# Patient Record
Sex: Male | Born: 1995 | Race: White | Hispanic: No | Marital: Single | Smoking: Current every day smoker
Health system: Southern US, Community
[De-identification: ages and names within clinical notes are randomized; demographics above are authoritative.]

## PROBLEM LIST (undated history)

## (undated) DIAGNOSIS — I10 Essential (primary) hypertension: Secondary | ICD-10-CM

## (undated) DIAGNOSIS — F909 Attention-deficit hyperactivity disorder, unspecified type: Secondary | ICD-10-CM

## (undated) DIAGNOSIS — I514 Myocarditis, unspecified: Secondary | ICD-10-CM

## (undated) HISTORY — PX: TONSILLECTOMY: SUR1361

## (undated) HISTORY — PX: WRIST SURGERY: SHX841

---
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
## 2014-04-26 ENCOUNTER — Other Ambulatory Visit (HOSPITAL_COMMUNITY): Payer: Self-pay | Admitting: Pulmonary Disease

## 2014-04-26 DIAGNOSIS — M5442 Lumbago with sciatica, left side: Secondary | ICD-10-CM

## 2014-05-07 ENCOUNTER — Ambulatory Visit (HOSPITAL_COMMUNITY): Admission: RE | Admit: 2014-05-07 | Payer: 59 | Source: Ambulatory Visit

## 2014-05-19 ENCOUNTER — Emergency Department
Admit: 2014-05-19 | Disposition: A | Discharge status: Home / Self Care | Payer: Self-pay | Admitting: Emergency Medicine

## 2014-06-01 ENCOUNTER — Other Ambulatory Visit (HOSPITAL_COMMUNITY): Payer: 59

## 2014-06-04 ENCOUNTER — Ambulatory Visit (HOSPITAL_COMMUNITY): Payer: 59

## 2014-10-18 ENCOUNTER — Emergency Department (HOSPITAL_COMMUNITY): Payer: 59

## 2014-10-18 ENCOUNTER — Emergency Department (HOSPITAL_COMMUNITY)
Admission: EM | Admit: 2014-10-18 | Discharge: 2014-10-18 | Disposition: A | Discharge status: Home / Self Care | Payer: 59 | Attending: Emergency Medicine | Admitting: Emergency Medicine

## 2014-10-18 ENCOUNTER — Encounter (HOSPITAL_COMMUNITY): Payer: Self-pay | Admitting: Emergency Medicine

## 2014-10-18 DIAGNOSIS — Z720 Tobacco use: Secondary | ICD-10-CM | POA: Diagnosis not present

## 2014-10-18 DIAGNOSIS — R079 Chest pain, unspecified: Secondary | ICD-10-CM | POA: Diagnosis present

## 2014-10-18 DIAGNOSIS — J9801 Acute bronchospasm: Secondary | ICD-10-CM | POA: Diagnosis not present

## 2014-10-18 LAB — BASIC METABOLIC PANEL
Anion gap: 7 (ref 5–15)
BUN: 15 mg/dL (ref 6–20)
CHLORIDE: 105 mmol/L (ref 101–111)
CO2: 27 mmol/L (ref 22–32)
CREATININE: 0.93 mg/dL (ref 0.61–1.24)
Calcium: 9.3 mg/dL (ref 8.9–10.3)
GFR calc Af Amer: >60 mL/min (ref >60)
GFR calc non Af Amer: >60 mL/min (ref >60)
GLUCOSE: 106 mg/dL — AB (ref 65–99)
POTASSIUM: 3.6 mmol/L (ref 3.5–5.1)
SODIUM: 139 mmol/L (ref 135–145)

## 2014-10-18 LAB — I-STAT TROPONIN, ED: Troponin i, poc: 0.01 ng/mL (ref 0.00–0.08)

## 2014-10-18 LAB — CBC
HEMATOCRIT: 44.8 % (ref 39.0–52.0)
Hemoglobin: 15.4 g/dL (ref 13.0–17.0)
MCH: 30.1 pg (ref 26.0–34.0)
MCHC: 34.4 g/dL (ref 30.0–36.0)
MCV: 87.5 fL (ref 78.0–100.0)
PLATELETS: 311 10*3/uL (ref 150–400)
RBC: 5.12 MIL/uL (ref 4.22–5.81)
RDW: 11.7 % (ref 11.5–15.5)
WBC: 8.2 10*3/uL (ref 4.0–10.5)

## 2014-10-18 MED ORDER — ALBUTEROL SULFATE HFA 108 (90 BASE) MCG/ACT IN AERS
2.0000 | INHALATION_SPRAY | RESPIRATORY_TRACT | Status: DC | PRN
  Filled 2014-10-18: qty 6.7

## 2014-10-18 MED ORDER — ASPIRIN 81 MG PO CHEW
324.0000 mg | CHEWABLE_TABLET | Freq: Once | ORAL | Status: AC
  Administered 2014-10-18: 324 mg via ORAL
  Filled 2014-10-18: qty 4

## 2014-10-18 MED ORDER — IPRATROPIUM-ALBUTEROL 0.5-2.5 (3) MG/3ML IN SOLN
3.0000 mL | RESPIRATORY_TRACT | Status: DC
  Administered 2014-10-18: 3 mL via RESPIRATORY_TRACT
  Filled 2014-10-18: qty 3

## 2014-10-18 NOTE — ED Provider Notes (Signed)
CSN: [id number]   Arrival date & time 10/18/14 0057  History  This chart was scribed for Paula Libra, MD by Bethel Born, ED Scribe. This patient was seen in room WA04/WA04 and the patient's care was started at 2:12 AM.  Chief Complaint  Patient presents with  . Chest Pain    HPI The history is provided by the patient. No language interpreter was used.   Evan Conner is a 19 y.o. male who presents to the Emergency Department complaining of an episode of central chest pain with sudden onset 1.5 hours ago at work. The pain started at the center of the chest and spread across the chest diffusely. He describes the pain as heavy "like someone is sitting on you". Movement and deep breathing exacerbated the pain, and he found it difficult to take a deep breath. The episode lasted for 20 minutes and he is pain free at present. He has had similar episodes at least twice in the past but this is the first time that he is being evaluated for it.  Associated symptoms include dizziness and sweating. Pt denies cough, SOB, nausea, vomiting, and LE pain or swelling. No history of asthma.   History reviewed. No pertinent past medical history.  Past Surgical History  Procedure Laterality Date  . Tonsillectomy      History reviewed. No pertinent family history.  Social History  Substance Use Topics  . Smoking status: Current Every Day Smoker -- 1.50 packs/day  . Smokeless tobacco: Former Neurosurgeon  . Alcohol Use: Yes     Review of Systems 10 Systems reviewed and all are negative for acute change except as noted in the HPI. Home Medications   Prior to Admission medications   Not on File    Allergies  Review of patient's allergies indicates no known allergies.  Triage Vitals: BP 138/78 mmHg  Pulse 62  Temp(Src) 98.3 F (36.8 C) (Oral)  Resp 20  SpO2 98%  Physical Exam General: Well-developed, well-nourished male in no acute distress; appearance consistent with age of record HENT:  normocephalic; atraumatic Eyes: pupils equal, round and reactive to light; extraocular muscles intact Neck: supple Heart: regular rate and rhythm; no murmurs, rubs or gallops Lungs: decreased air movement bilaterally without frank wheezing Abdomen: soft; nondistended; nontender; no masses or hepatosplenomegaly; bowel sounds present Extremities: No deformity or edema; full range of motion; pulses normal Neurologic: Awake, alert and oriented; motor function intact in all extremities and symmetric; no facial droop Skin: Warm and dry Psychiatric: Normal mood and affect  ED Course  Procedures   DIAGNOSTIC STUDIES: Oxygen Saturation is 98% on RA, normal by my interpretation.    COORDINATION OF CARE: 2:18 AM Discussed treatment plan which includes CXR, EKG, lab work, and a breathing treatment with pt at bedside and pt agreed to plan.  EKG Interpretation  Date/Time:  Monday October 18 2014 01:00:50 EDT Ventricular Rate:  72 PR Interval:  151 QRS Duration: 112 QT Interval:  394 QTC Calculation: 431 R Axis:   91 Text Interpretation:  Sinus rhythm Rate is slower, otherwise no significant changes Confirmed by Read Drivers  MD, Jonny Ruiz ([id number]) on 10/18/2014 2:11:32 AM    MDM   Nursing notes and vitals signs, including pulse oximetry, reviewed.  Summary of this visit's results, reviewed by myself:  Labs:  Results for orders placed or performed during the hospital encounter of 10/18/14 (from the past 24 hour(s))  Basic metabolic panel     Status: Abnormal   Collection Time: 10/18/14  1:09 AM  Result Value Ref Range   Sodium 139 135 - 145 mmol/L   Potassium 3.6 3.5 - 5.1 mmol/L   Chloride 105 101 - 111 mmol/L   CO2 27 22 - 32 mmol/L   Glucose, Bld 106 (H) 65 - 99 mg/dL   BUN 15 6 - 20 mg/dL   Creatinine, Ser 7.42 0.61 - 1.24 mg/dL   Calcium 9.3 8.9 - 59.5 mg/dL   GFR calc non Af Amer >60 >60 mL/min   GFR calc Af Amer >60 >60 mL/min   Anion gap 7 5 - 15  CBC     Status: None    Collection Time: 10/18/14  1:09 AM  Result Value Ref Range   WBC 8.2 4.0 - 10.5 K/uL   RBC 5.12 4.22 - 5.81 MIL/uL   Hemoglobin 15.4 13.0 - 17.0 g/dL   HCT 63.8 75.6 - 43.3 %   MCV 87.5 78.0 - 100.0 fL   MCH 30.1 26.0 - 34.0 pg   MCHC 34.4 30.0 - 36.0 g/dL   RDW 29.5 18.8 - 41.6 %   Platelets 311 150 - 400 K/uL  I-stat troponin, ED     Status: None   Collection Time: 10/18/14  1:13 AM  Result Value Ref Range   Troponin i, poc 0.01 0.00 - 0.08 ng/mL   Comment 3            Imaging Studies: Dg Chest 2 View  10/18/2014   CLINICAL DATA:  Bilateral chest pain, onset tonight. Mild cough. Current smoker.  EXAM: CHEST  2 VIEW  COMPARISON:  09/18/2013  FINDINGS: The heart size and mediastinal contours are within normal limits. Both lungs are clear. The visualized skeletal structures are unremarkable.  IMPRESSION: No active cardiopulmonary disease.   Electronically Signed   By: Burman Nieves M.D.   On: 10/18/2014 01:17   3:20 AM Air movement improved, faint expiratory wheeze in right base after DuoNeb treatment. Patient states his breathing is easier and feels improved. Patient's history and exam are more consistent with bronchospasm then cardiac etiology.   I personally performed the services described in this documentation, which was scribed in my presence. The recorded information has been reviewed and is accurate.   Paula Libra, MD 10/18/14 [phone]

## 2014-10-18 NOTE — Discharge Instructions (Signed)
Bronchospasm °A bronchospasm is a spasm or tightening of the airways going into the lungs. During a bronchospasm breathing becomes more difficult because the airways get smaller. When this happens there can be coughing, a whistling sound when breathing (wheezing), and difficulty breathing. Bronchospasm is often associated with asthma, but not all patients who experience a bronchospasm have asthma. °CAUSES  °A bronchospasm is caused by inflammation or irritation of the airways. The inflammation or irritation may be triggered by:  °· Allergies (such as to animals, pollen, food, or mold). Allergens that cause bronchospasm may cause wheezing immediately after exposure or many hours later.   °· Infection. Viral infections are believed to be the most common cause of bronchospasm.   °· Exercise.   °· Irritants (such as pollution, cigarette smoke, strong odors, aerosol sprays, and paint fumes).   °· Weather changes. Winds increase molds and pollens in the air. Rain refreshes the air by washing irritants out. Cold air may cause inflammation.   °· Stress and emotional upset.   °SIGNS AND SYMPTOMS  °· Wheezing.   °· Excessive nighttime coughing.   °· Frequent or severe coughing with a simple cold.   °· Chest tightness.   °· Shortness of breath.   °DIAGNOSIS  °Bronchospasm is usually diagnosed through a history and physical exam. Tests, such as chest X-rays, are sometimes done to look for other conditions. °TREATMENT  °· Inhaled medicines can be given to open up your airways and help you breathe. The medicines can be given using either an inhaler or a nebulizer machine. °· Corticosteroid medicines may be given for severe bronchospasm, usually when it is associated with asthma. °HOME CARE INSTRUCTIONS  °· Always have a plan prepared for seeking medical care. Know when to call your health care provider and local emergency services (911 in the [place] Know where you can access local emergency care. °· Only take medicines as  directed by your health care provider. °· If you were prescribed an inhaler or nebulizer machine, ask your health care provider to explain how to use it correctly. Always use a spacer with your inhaler if you were given one. °· It is necessary to remain calm during an attack. Try to relax and breathe more slowly.  °· Control your home environment in the following ways:   °¨ Change your heating and air conditioning filter at least once a month.   °¨ Limit your use of fireplaces and wood stoves. °¨ Do not smoke and do not allow smoking in your home.   °¨ Avoid exposure to perfumes and fragrances.   °¨ Get rid of pests (such as roaches and mice) and their droppings.   °¨ Throw away plants if you see mold on them.   °¨ Keep your house clean and dust free.   °¨ Replace carpet with wood, tile, or vinyl flooring. Carpet can trap dander and dust.   °¨ Use allergy-proof pillows, mattress covers, and box spring covers.   °¨ Wash bed sheets and blankets every week in hot water and dry them in a dryer.   °¨ Use blankets that are made of polyester or cotton.   °¨ Wash hands frequently. °SEEK MEDICAL CARE IF:  °· You have muscle aches.   °· You have chest pain.   °· The sputum changes from clear or white to yellow, green, gray, or bloody.   °· The sputum you cough up gets thicker.   °· There are problems that may be related to the medicine you are given, such as a rash, itching, swelling, or trouble breathing.   °SEEK IMMEDIATE MEDICAL CARE IF:  °· You have worsening wheezing and coughing even   after taking your prescribed medicines.   °· You have increased difficulty breathing.   °· You develop severe chest pain. °MAKE SURE YOU:  °· Understand these instructions. °· Will watch your condition. °· Will get help right away if you are not doing well or get worse. °Document Released: 01/18/2003 Document Revised: 01/20/2013 Document Reviewed: 07/07/2012 °[employer]® Patient Information ©2015 [employer]. This information is not  intended to replace advice given to you by your health care provider. Make sure you discuss any questions you have with your health care provider. ° °

## 2014-10-18 NOTE — ED Notes (Signed)
Pt to xray

## 2014-10-18 NOTE — ED Notes (Signed)
Pt states that he started having CP suddenly tonight approx. 20 mins ago. Hx of same but did not go see a doctor. Denies SOB. Alert and oriented.

## 2014-10-18 NOTE — ED Notes (Signed)
Pt states that he has had this pain before. After 20 min it starts to go away. Pt states the pain is now easing off.

## 2014-12-23 ENCOUNTER — Emergency Department (HOSPITAL_COMMUNITY)
Admission: EM | Admit: 2014-12-23 | Discharge: 2014-12-23 | Discharge status: Against Medical Advice | Payer: 59 | Attending: Emergency Medicine | Admitting: Emergency Medicine

## 2014-12-23 ENCOUNTER — Encounter (HOSPITAL_COMMUNITY): Payer: Self-pay

## 2014-12-23 DIAGNOSIS — F17210 Nicotine dependence, cigarettes, uncomplicated: Secondary | ICD-10-CM | POA: Diagnosis not present

## 2014-12-23 DIAGNOSIS — Z9089 Acquired absence of other organs: Secondary | ICD-10-CM | POA: Diagnosis not present

## 2014-12-23 DIAGNOSIS — J029 Acute pharyngitis, unspecified: Secondary | ICD-10-CM | POA: Diagnosis not present

## 2014-12-23 LAB — RAPID STREP SCREEN (MED CTR MEBANE ONLY): STREPTOCOCCUS, GROUP A SCREEN (DIRECT): NEGATIVE

## 2014-12-23 MED ORDER — LIDOCAINE VISCOUS 2 % MT SOLN
15.0000 mL | Freq: Once | OROMUCOSAL | Status: AC
  Administered 2014-12-23: 15 mL via OROMUCOSAL
  Filled 2014-12-23: qty 15

## 2014-12-23 NOTE — ED Notes (Signed)
Pt here with c/o throat "throbbing" onset this afternoon, denies cough. He states it feels like his throat is swollen but airway intact, no swelling noted upon assessment. Denies exposure to any allergens.

## 2014-12-23 NOTE — ED Notes (Signed)
Pt observed leaving fast track area. Has not returned. Will take out of system AMA.

## 2014-12-23 NOTE — ED Provider Notes (Signed)
CSN: [id number]     Arrival date & time 12/23/14  2045 History  By signing my name below, I, Freida BusmanDiana Omoyeni, attest that this documentation has been prepared under the direction and in the presence of non-physician practitioner, Sharilyn SitesLisa Analie Katzman, PA-C. Electronically Signed: Freida Busmaniana Omoyeni, Scribe. 12/23/2014. 9:38 PM.  Chief Complaint  Patient presents with  . Sore Throat   The history is provided by the patient. No language interpreter was used.    HPI Comments:  Evan Conner is a 19 y.o. male with a history of tonsillectomy and adenoidectomy, who presents to the Emergency Department complaining of sore throat that began ~ 1700 today. He describes his pain as throbbing. His pain is exacerbated when swallowing. He denies fever, rhinorrhea, and otalgia. No new medications or foods.  No known allergies.  He is tolerating his secretions. No alleviating factors noted; he has used lozenges and chloraseptic without relief. Pt has no other complaints or symptoms at this time.  Patient is requesting pain medication several times during exam.  VSS.  History reviewed. No pertinent past medical history. Past Surgical History  Procedure Laterality Date  . Tonsillectomy     No family history on file. Social History  Substance Use Topics  . Smoking status: Current Every Day Smoker -- 1.50 packs/day  . Smokeless tobacco: Former NeurosurgeonUser  . Alcohol Use: Yes    Review of Systems  Constitutional: Negative for fever and chills.  HENT: Positive for sore throat. Negative for ear pain and rhinorrhea.   Respiratory: Negative for shortness of breath.   Cardiovascular: Negative for chest pain.  All other systems reviewed and are negative.   Allergies  Review of patient's allergies indicates no known allergies.  Home Medications   Prior to Admission medications   Not on File   BP 122/65 mmHg  Pulse 79  Temp(Src) 98.3 F (36.8 C) (Oral)  Resp 16  Ht 6' (1.829 m)  Wt 240 lb (108.863 kg)  BMI 32.54  kg/m2  SpO2 97%   Physical Exam  Constitutional: He is oriented to person, place, and time. He appears well-developed and well-nourished.  HENT:  Head: Normocephalic and atraumatic.  Mouth/Throat: Oropharynx is clear and moist.  Tonsils absent bilaterally; uvula midline without evidence of peritonsillar abscess; handling secretions appropriately; no difficulty swallowing or speaking; normal phonation, no stridor; no lymphadenopathy  Eyes: Conjunctivae and EOM are normal. Pupils are equal, round, and reactive to light.  Neck: Normal range of motion. Neck supple.  Cardiovascular: Normal rate, regular rhythm and normal heart sounds.   Pulmonary/Chest: Effort normal and breath sounds normal. No respiratory distress. He has no wheezes.  Musculoskeletal: Normal range of motion.  Neurological: He is alert and oriented to person, place, and time.  Skin: Skin is warm and dry.  Psychiatric: He has a normal mood and affect.  Nursing note and vitals reviewed.   ED Course  Procedures   DIAGNOSTIC STUDIES:  Oxygen Saturation is 97% on RA, normal by my interpretation.    COORDINATION OF CARE:  9:23 PM Discussed treatment plan with pt at bedside and pt agreed to plan.  Labs Review Labs Reviewed  RAPID STREP SCREEN (NOT [facility])    Imaging Review No results found. I have personally reviewed and evaluated these lab results as part of my medical decision-making.   MDM   Final diagnoses:  None   19 year old male here with sore throat. He is status post tonsillectomy and adenoidectomy. Oropharynx is completely normal on exam without significant  erythema or swelling. He is handling his secretions well, no difficulty swallowing. Normal phonation without stridor. No cervical lymphadenopathy. Does not appear to be allergic reaction/angioedema.  Rapid strep was obtained and sent, however patient was observed leaving fast track area prior to results/final assessment and did not return.  Patient  left AMA.  I personally performed the services described in this documentation, which was scribed in my presence. The recorded information has been reviewed and is accurate.  Garlon Hatchet, PA-C 12/23/14 2240  Geoffery Lyons, MD 12/23/14 [phone]

## 2014-12-26 LAB — CULTURE, GROUP A STREP: Strep A Culture: NEGATIVE

## 2015-06-10 ENCOUNTER — Emergency Department: Payer: 59

## 2015-06-10 ENCOUNTER — Emergency Department
Admission: EM | Admit: 2015-06-10 | Discharge: 2015-06-10 | Disposition: A | Discharge status: Home / Self Care | Payer: 59 | Attending: Emergency Medicine | Admitting: Emergency Medicine

## 2015-06-10 ENCOUNTER — Encounter: Payer: Self-pay | Admitting: Emergency Medicine

## 2015-06-10 DIAGNOSIS — F909 Attention-deficit hyperactivity disorder, unspecified type: Secondary | ICD-10-CM | POA: Insufficient documentation

## 2015-06-10 DIAGNOSIS — S62001A Unspecified fracture of navicular [scaphoid] bone of right wrist, initial encounter for closed fracture: Secondary | ICD-10-CM | POA: Diagnosis not present

## 2015-06-10 DIAGNOSIS — Y998 Other external cause status: Secondary | ICD-10-CM | POA: Insufficient documentation

## 2015-06-10 DIAGNOSIS — F1290 Cannabis use, unspecified, uncomplicated: Secondary | ICD-10-CM | POA: Diagnosis not present

## 2015-06-10 DIAGNOSIS — F17200 Nicotine dependence, unspecified, uncomplicated: Secondary | ICD-10-CM | POA: Insufficient documentation

## 2015-06-10 DIAGNOSIS — W1839XA Other fall on same level, initial encounter: Secondary | ICD-10-CM | POA: Insufficient documentation

## 2015-06-10 DIAGNOSIS — Y9367 Activity, basketball: Secondary | ICD-10-CM | POA: Insufficient documentation

## 2015-06-10 DIAGNOSIS — S6991XA Unspecified injury of right wrist, hand and finger(s), initial encounter: Secondary | ICD-10-CM | POA: Diagnosis present

## 2015-06-10 DIAGNOSIS — Y929 Unspecified place or not applicable: Secondary | ICD-10-CM | POA: Insufficient documentation

## 2015-06-10 HISTORY — DX: Attention-deficit hyperactivity disorder, unspecified type: F90.9

## 2015-06-10 MED ORDER — OXYCODONE-ACETAMINOPHEN 5-325 MG PO TABS: 1.0000 | ORAL_TABLET | Freq: Four times a day (QID) | ORAL | Status: DC | PRN

## 2015-06-10 MED ORDER — OXYCODONE-ACETAMINOPHEN 5-325 MG PO TABS
1.0000 | ORAL_TABLET | Freq: Once | ORAL | Status: AC
  Administered 2015-06-10: 1 via ORAL
  Filled 2015-06-10: qty 1

## 2015-06-10 NOTE — ED Notes (Signed)
States he was playing b b yesterday and rolled his left ankle but landed on right wrist  Swelling noted to right wrist  Positive pulses

## 2015-06-10 NOTE — ED Notes (Signed)
Patient to ER for right sided wrist pain after basketball injury yesterday.

## 2015-06-10 NOTE — Discharge Instructions (Signed)
Cast or Splint Care °Casts and splints support injured limbs and keep bones from moving while they heal. It is important to care for your cast or splint at home.   °HOME CARE INSTRUCTIONS °· Keep the cast or splint uncovered during the drying period. It can take 24 to 48 hours to dry if it is made of plaster. A fiberglass cast will dry in less than 1 hour. °· Do not rest the cast on anything harder than a pillow for the first 24 hours. °· Do not put weight on your injured limb or apply pressure to the cast until your health care provider gives you permission. °· Keep the cast or splint dry. Wet casts or splints can lose their shape and may not support the limb as well. A wet cast that has lost its shape can also create harmful pressure on your skin when it dries. Also, wet skin can become infected. °¨ Cover the cast or splint with a plastic bag when bathing or when out in the rain or snow. If the cast is on the trunk of the body, take sponge baths until the cast is removed. °¨ If your cast does become wet, dry it with a towel or a blow dryer on the cool setting only. °· Keep your cast or splint clean. Soiled casts may be wiped with a moistened cloth. °· Do not place any hard or soft foreign objects under your cast or splint, such as cotton, toilet paper, lotion, or powder. °· Do not try to scratch the skin under the cast with any object. The object could get stuck inside the cast. Also, scratching could lead to an infection. If itching is a problem, use a blow dryer on a cool setting to relieve discomfort. °· Do not trim or cut your cast or remove padding from inside of it. °· Exercise all joints next to the injury that are not immobilized by the cast or splint. For example, if you have a long leg cast, exercise the hip joint and toes. If you have an arm cast or splint, exercise the shoulder, elbow, thumb, and fingers. °· Elevate your injured arm or leg on 1 or 2 pillows for the first 1 to 3 days to decrease  swelling and pain. It is best if you can comfortably elevate your cast so it is higher than your heart. °SEEK MEDICAL CARE IF:  °· Your cast or splint cracks. °· Your cast or splint is too tight or too loose. °· You have unbearable itching inside the cast. °· Your cast becomes wet or develops a soft spot or area. °· You have a bad smell coming from inside your cast. °· You get an object stuck under your cast. °· Your skin around the cast becomes red or raw. °· You have new pain or worsening pain after the cast has been applied. °SEEK IMMEDIATE MEDICAL CARE IF:  °· You have fluid leaking through the cast. °· You are unable to move your fingers or toes. °· You have discolored (blue or white), cool, painful, or very swollen fingers or toes beyond the cast. °· You have tingling or numbness around the injured area. °· You have severe pain or pressure under the cast. °· You have any difficulty with your breathing or have shortness of breath. °· You have chest pain. °  °This information is not intended to replace advice given to you by your health care provider. Make sure you discuss any questions you have with your health care   provider. °  °Document Released: 01/13/2000 Document Revised: 11/05/2012 Document Reviewed: 07/24/2012 °Elsevier Interactive Patient Education ©2016 [employer] ° °Scaphoid Fracture, Wrist °A fracture is a break in the bone. The bone you have broken often does not show up as a fracture on x-ray until later on in the healing phase. This bone is called the scaphoid bone. With this bone, your caregiver will often cast or splint your wrist as though it is fractured, even if a fracture is not seen on the x-ray. This is often done with wrist injuries in which there is tenderness at the base of the thumb. An x-ray at 1-3 weeks after your injury may confirm this fracture. A cast or splint is used to protect and keep your injured bone in good position for healing. The cast or splint will be on generally  for about 6 to 16 weeks, depending on your health, age, the fracture location and how quickly you heal. Another name for the scaphoid bone is the navicular bone. °HOME CARE INSTRUCTIONS  °· To lessen the swelling and pain, keep the injured part elevated above your heart while sitting or lying down. °· Apply ice to the injury for 15-20 minutes, 03-04 times per day while awake, for 2 days. Put the ice in a plastic bag and place a thin towel between the bag of ice and your cast. °· If you have a plaster or fiberglass cast or splint: °¨ Do not try to scratch the skin under the cast using sharp or pointed objects. °¨ Check the skin around the cast every day. You may put lotion on any red or sore areas. °¨ Keep your cast or splint dry and clean. °· If you have a plaster splint: °¨ Wear the splint as directed. °¨ You may loosen the elastic bandage around the splint if your fingers become numb, tingle, or turn cold or blue. °¨ If you have been put in a removable splint, wear and use as directed. °· Do not put pressure on any part of your cast or splint; it may deform or break. Rest your cast or splint only on a pillow the first 24 hours until it is fully hardened. °· Your cast or splint can be protected during bathing with a plastic bag. Do not lower the cast or splint into water. °· Only take over-the-counter or prescription medicines for pain, discomfort, or fever as directed by your caregiver. °· If your caregiver has given you a follow up appointment, it is very important to keep that appointment. Not keeping the appointment could result in chronic pain and decreased function. If there is any problem keeping the appointment, you must call back to this facility for assistance. °SEEK IMMEDIATE MEDICAL CARE IF:  °· Your cast gets damaged, wet or breaks. °· You have continued severe pain or more swelling than you did before the cast or splint was put on. °· Your skin or nails below the injury turn blue or gray, or feel cold  or numb. °· You have tingling or burning pain in your fingers or increasing pain with movement of your fingers °  °This information is not intended to replace advice given to you by your health care provider. Make sure you discuss any questions you have with your health care provider. °  °Document Released: 01/05/2002 Document Revised: 04/09/2011 Document Reviewed: 07/28/2014 °Elsevier Interactive Patient Education ©2016 [employer] ° °

## 2015-06-10 NOTE — ED Provider Notes (Signed)
[facility] Emergency Department Provider Note  ____________________________________________  Time seen: Approximately 3:26 PM  I have reviewed the triage vital signs and the nursing notes.   HISTORY  Chief Complaint Wrist Pain    HPI Benedict NeedyCory Enright is a 20 y.o. male who presents emergency department complaining of right wrist/thumb pain. Patient states that he was playing basketball with his nieces and nephews yesterday when he landed awkwardly on his ankle and fell landing on his right wrist. Patient states that he chronically sprained his ankle and is not concerned about same at this time. Patient states that he has swelling and pain to the right wrist base of the thumb area. Patient states that the pain is sharp, constant, much worse with movement. Patient has not tried any medications prior to arrival.   Past Medical History  Diagnosis Date  . ADHD (attention deficit hyperactivity disorder)     There are no active problems to display for this patient.   Past Surgical History  Procedure Laterality Date  . Tonsillectomy      Current Outpatient Rx  Name  Route  Sig  Dispense  Refill  . amphetamine-dextroamphetamine (ADDERALL XR) 15 MG 24 hr capsule   Oral   Take 15 mg by mouth every morning.         Marland Kitchen. oxyCODONE-acetaminophen (ROXICET) 5-325 MG tablet   Oral   Take 1 tablet by mouth every 6 (six) hours as needed for severe pain.   20 tablet   0     Allergies Review of patient's allergies indicates no known allergies.  No family history on file.  Social History Social History  Substance Use Topics  . Smoking status: Current Every Day Smoker -- 1.50 packs/day  . Smokeless tobacco: Former NeurosurgeonUser  . Alcohol Use: Yes     Review of Systems  Constitutional: No fever/chills Cardiovascular: no chest pain. Respiratory: no cough. No SOB. Musculoskeletal: Positive for right wrist pain. Skin: Negative for rash, abrasions, lacerations,  ecchymosis. Neurological: Negative for headaches, focal weakness or numbness. 10-point ROS otherwise negative.  ____________________________________________   PHYSICAL EXAM:  VITAL SIGNS: ED Triage Vitals  Enc Vitals Group     BP 06/10/15 1357 130/73 mmHg     Pulse Rate 06/10/15 1357 95     Resp 06/10/15 1357 20     Temp 06/10/15 1357 97.8 F (36.6 C)     Temp Source 06/10/15 1357 Oral     SpO2 06/10/15 1357 96 %     Weight 06/10/15 1357 255 lb (115.667 kg)     Height 06/10/15 1357 6\' 1"  (1.854 m)     Head Cir --      Peak Flow --      Pain Score 06/10/15 1358 9     Pain Loc --      Pain Edu? --      Excl. in GC? --      Constitutional: Alert and oriented. Well appearing and in no acute distress. Eyes: Conjunctivae are normal. PERRL. EOMI. Head: Atraumatic. Cardiovascular: Normal rate, regular rhythm. Normal S1 and S2.  Good peripheral circulation. Respiratory: Normal respiratory effort without tachypnea or retractions. Lungs CTAB. Good air entry to the bases with no decreased or absent breath sounds. Musculoskeletal: Edema is noted to snuffbox/MCP joint of the right wrist. No deformity noted. Limited range of motion due to pain. Patient is exquisitely tender to palpation over the snuffbox. No palpable abnormality. Patient is able to move all 5 digits. Sensation intact  5 digits. Radial pulse and cap refill intact 5 digits. Neurologic:  Normal speech and language. No gross focal neurologic deficits are appreciated.  Skin:  Skin is warm, dry and intact. No rash noted. Psychiatric: Mood and affect are normal. Speech and behavior are normal. Patient exhibits appropriate insight and judgement.   ____________________________________________   LABS (all labs ordered are listed, but only abnormal results are displayed)  Labs Reviewed - No data to display ____________________________________________  EKG   ____________________________________________  RADIOLOGY Festus Barren Cuthriell, personally viewed and evaluated these images (plain radiographs) as part of my medical decision making, as well as reviewing the written report by the radiologist.  Dg Wrist Complete Right  06/10/2015  CLINICAL DATA:  Fall wall playing basketball with wrist pain, initial encounter EXAM: RIGHT WRIST - COMPLETE 3+ VIEW COMPARISON:  None. FINDINGS: There is a minimally displaced mid body fracture of the scaphoid bone seen. No other fractures are noted. No soft tissue abnormality is seen. IMPRESSION: Midbody fracture of the scaphoid bone Electronically Signed   By: Alcide Clever M.D.   On: 06/10/2015 15:09    ____________________________________________    PROCEDURES  Procedure(s) performed:       Medications  oxyCODONE-acetaminophen (PERCOCET/ROXICET) 5-325 MG per tablet 1 tablet (1 tablet Oral Given 06/10/15 1559)     ____________________________________________   INITIAL IMPRESSION / ASSESSMENT AND PLAN / ED COURSE  Pertinent labs & imaging results that were available during my care of the patient were reviewed by me and considered in my medical decision making (see chart for details).  Patient's diagnosis is consistent with scaphoid fracture to the right hand. X-ray reveals the above fracture. Patient is placed in a thumb spica splint and advised to follow-up with hand surgery in [place].. Patient will be discharged home with prescriptions for pain medication. Patient is given ED precautions to return to the ED for any worsening or new symptoms.     ____________________________________________  FINAL CLINICAL IMPRESSION(S) / ED DIAGNOSES  Final diagnoses:  Scaphoid fracture of wrist, right, closed, initial encounter      NEW MEDICATIONS STARTED DURING THIS VISIT:  Discharge Medication List as of 06/10/2015  3:53 PM    START taking these medications   Details  oxyCODONE-acetaminophen (ROXICET) 5-325 MG tablet Take 1 tablet by mouth every 6  (six) hours as needed for severe pain., Starting 06/10/2015, Until Discontinued, Print            This chart was dictated using voice recognition software/Dragon. Despite best efforts to proofread, errors can occur which can change the meaning. Any change was purely unintentional.    Racheal Patches, PA-C 06/10/15 1632  Jene Every, MD 06/10/15 Ernestina [place]

## 2016-02-23 ENCOUNTER — Encounter: Payer: Self-pay | Admitting: *Deleted

## 2016-02-23 ENCOUNTER — Emergency Department
Admission: EM | Admit: 2016-02-23 | Discharge: 2016-02-23 | Disposition: A | Discharge status: Home / Self Care | Payer: 59 | Attending: Emergency Medicine | Admitting: Emergency Medicine

## 2016-02-23 DIAGNOSIS — F909 Attention-deficit hyperactivity disorder, unspecified type: Secondary | ICD-10-CM | POA: Insufficient documentation

## 2016-02-23 DIAGNOSIS — F17210 Nicotine dependence, cigarettes, uncomplicated: Secondary | ICD-10-CM | POA: Insufficient documentation

## 2016-02-23 DIAGNOSIS — R0981 Nasal congestion: Secondary | ICD-10-CM | POA: Diagnosis present

## 2016-02-23 DIAGNOSIS — K0889 Other specified disorders of teeth and supporting structures: Secondary | ICD-10-CM | POA: Diagnosis not present

## 2016-02-23 DIAGNOSIS — A084 Viral intestinal infection, unspecified: Secondary | ICD-10-CM

## 2016-02-23 LAB — INFLUENZA PANEL BY PCR (TYPE A & B)
INFLAPCR: NEGATIVE
INFLBPCR: NEGATIVE

## 2016-02-23 MED ORDER — AMOXICILLIN 875 MG PO TABS: 875.0000 mg | ORAL_TABLET | Freq: Two times a day (BID) | ORAL | 0 refills | Status: DC

## 2016-02-23 MED ORDER — KETOROLAC TROMETHAMINE 30 MG/ML IJ SOLN
30.0000 mg | Freq: Once | INTRAMUSCULAR | Status: AC
  Administered 2016-02-23: 30 mg via INTRAMUSCULAR
  Filled 2016-02-23: qty 1

## 2016-02-23 MED ORDER — ONDANSETRON 8 MG PO TBDP
8.0000 mg | ORAL_TABLET | Freq: Once | ORAL | Status: AC
Start: 2016-02-23 — End: 2016-02-23
  Administered 2016-02-23: 8 mg via ORAL
  Filled 2016-02-23: qty 1

## 2016-02-23 MED ORDER — ONDANSETRON 4 MG PO TBDP: 4.0000 mg | ORAL_TABLET | Freq: Three times a day (TID) | ORAL | 0 refills | Status: DC | PRN

## 2016-02-23 NOTE — ED Triage Notes (Signed)
Pt states cough and body aches for 2 days

## 2016-02-23 NOTE — ED Notes (Signed)
Given g'ale  Was unable to keep g'ale down but was able to eat ice  PA aware

## 2016-02-23 NOTE — Discharge Instructions (Signed)
Stay on clear liquids for the next 24 hours. Zofran on your tongue every 8 hours as needed for nausea or vomiting. Tylenol as needed for body aches or fever. Follow-up with Dr. Juanetta GoslingHawkins if any continued problems. He also given a prescription for amoxicillin 875 twice a day for 10 days. You should make an appointment with your dentist for recheck for dental pain.

## 2016-02-23 NOTE — ED Provider Notes (Signed)
[facility] Emergency Department Provider Note  ____________________________________________   First MD Initiated Contact with Patient 02/23/16 [phone]     (approximate)  I have reviewed the triage vital signs and the nursing notes.   HISTORY  Chief Complaint Generalized Body Aches   HPI Evan Conner is a 21 y.o. male is here complaint of body aches and cough for the last 2 days. Patient complains of nasal congestion.He states he is unable to eat or drink due to vomiting. He has vomited twice today. He denies any diarrhea. He also complains of increased pain and swelling in his right upper gum. He states he had dental work done one month ago and is still having pain. He called the dentist where he had his dental extraction done and was told that they would not re-x-ray that area. Patient has continued to smoke one half pack cigarettes per day but since he has been sick the last 2 days has decreased his smoking. He denies any previous history of asthma, bronchitis or pneumonia. He currently rates his pain is 5/10.   Past Medical History:  Diagnosis Date  . ADHD (attention deficit hyperactivity disorder)     There are no active problems to display for this patient.   Past Surgical History:  Procedure Laterality Date  . TONSILLECTOMY      Prior to Admission medications   Medication Sig Start Date End Date Taking? Authorizing Provider  amoxicillin (AMOXIL) 875 MG tablet Take 1 tablet (875 mg total) by mouth 2 (two) times daily. 02/23/16   Tommi Rumpshonda L Summers, PA-C  amphetamine-dextroamphetamine (ADDERALL XR) 15 MG 24 hr capsule Take 15 mg by mouth every morning.    Historical Provider, MD  ondansetron (ZOFRAN ODT) 4 MG disintegrating tablet Take 1 tablet (4 mg total) by mouth every 8 (eight) hours as needed for nausea or vomiting. 02/23/16   Tommi Rumpshonda L Summers, PA-C    Allergies Patient has no known allergies.  History reviewed. No pertinent family  history.  Social History Social History  Substance Use Topics  . Smoking status: Current Every Day Smoker    Packs/day: 1.50  . Smokeless tobacco: Former NeurosurgeonUser  . Alcohol use Yes    Review of Systems Constitutional: Positive fever/chills Eyes: No visual changes. ENT: No sore throat. Also dental pain. Cardiovascular: Denies chest pain. Respiratory: Denies shortness of breath. Positive cough. Gastrointestinal: No abdominal pain.  Positive nausea, positive vomiting. Positive diarrhea.   Musculoskeletal: Negative for back pain. Skin: Negative for rash. Neurological: Negative for headaches, focal weakness or numbness.  10-point ROS otherwise negative.  ____________________________________________   PHYSICAL EXAM:  VITAL SIGNS: ED Triage Vitals [02/23/16 0904]  Enc Vitals Group     BP 132/64     Pulse Rate 77     Resp 18     Temp 97.5 F (36.4 C)     Temp Source Oral     SpO2 98 %     Weight 255 lb (115.7 kg)     Height 6' (1.829 m)     Head Circumference      Peak Flow      Pain Score 5     Pain Loc      Pain Edu?      Excl. in GC?     Constitutional: Alert and oriented. Well appearing and in no acute distress. Eyes: Conjunctivae are normal. PERRL. EOMI. Head: Atraumatic. Nose: Mild congestion/rhinnorhea. EACs are clear, TMs are dull bilaterally. Mouth/Throat: Mucous membranes are moist.  Oropharynx non-erythematous. Right upper gum is edematous with large cavity noted posterior molar. There is no drainage from the area. Neck: No stridor.   Hematological/Lymphatic/Immunilogical: No cervical lymphadenopathy. Cardiovascular: Normal rate, regular rhythm. Grossly normal heart sounds.  Good peripheral circulation. Respiratory: Normal respiratory effort.  No retractions. Lungs CTAB. Gastrointestinal: Soft and nontender. No distention. Bowel sounds normoactive 4 quadrants. Musculoskeletal: Moves upper and lower extremities without any difficulty. Normal gait was  noted. Neurologic:  Normal speech and language. No gross focal neurologic deficits are appreciated. No gait instability. Skin:  Skin is warm, dry and intact. No rash noted. Psychiatric: Mood and affect are normal. Speech and behavior are normal.  ____________________________________________   LABS (all labs ordered are listed, but only abnormal results are displayed)  Labs Reviewed  INFLUENZA PANEL BY PCR (TYPE A & B)    PROCEDURES  Procedure(s) performed: None  Procedures  Critical Care performed: No  ____________________________________________   INITIAL IMPRESSION / ASSESSMENT AND PLAN / ED COURSE  Pertinent labs & imaging results that were available during my care of the patient were reviewed by me and considered in my medical decision making (see chart for details).  Patient was made aware that his influenza test was negative. He is given Zofran 8 mg ODT while in the department for nausea and vomiting. He was given Toradol 30 mg IM for dental pain. Patient is encouraged to drink fluids for the next 24 hours. He is also given a prescription for amoxicillin 875 one twice a day for 10 days for his dental pain. He may also continue taking Tylenol as tolerated for his dental pain. He is encouraged that after viral gastroenteritis has resolved to contact his dentist for any further dental pain. Patient was able to tolerate ice chips prior to discharge. Patient was ambulatory without assistance.      ____________________________________________   FINAL CLINICAL IMPRESSION(S) / ED DIAGNOSES  Final diagnoses:  Viral gastroenteritis  Pain, dental      NEW MEDICATIONS STARTED DURING THIS VISIT:  Discharge Medication List as of 02/23/2016 12:51 PM    START taking these medications   Details  amoxicillin (AMOXIL) 875 MG tablet Take 1 tablet (875 mg total) by mouth 2 (two) times daily., Starting Thu 02/23/2016, Print    ondansetron (ZOFRAN ODT) 4 MG disintegrating tablet  Take 1 tablet (4 mg total) by mouth every 8 (eight) hours as needed for nausea or vomiting., Starting Thu 02/23/2016, Print         Note:  This document was prepared using Dragon voice recognition software and may include unintentional dictation errors.    Tommi Rumps, PA-C 02/23/16 1443    Emily Filbert, MD 02/23/16 [phone]

## 2016-02-23 NOTE — ED Notes (Signed)
zofran given for nausea  No vomiting noted at present  But remains uncomfortable

## 2016-02-23 NOTE — ED Notes (Signed)
Pt is resting with eyes closed  Po fluids given

## 2016-02-23 NOTE — ED Notes (Signed)
See triage note  States he developed cough  Nasal congestion and fever for 2 days   Afebrile on arrival   Also has complaints of increased gum/tooth pain  Had some dental work done about 1 month ago still having pain

## 2016-03-10 ENCOUNTER — Emergency Department (HOSPITAL_COMMUNITY)
Admission: EM | Admit: 2016-03-10 | Discharge: 2016-03-10 | Disposition: A | Discharge status: Home / Self Care | Payer: 59 | Attending: Emergency Medicine | Admitting: Emergency Medicine

## 2016-03-10 ENCOUNTER — Encounter (HOSPITAL_COMMUNITY): Payer: Self-pay | Admitting: Emergency Medicine

## 2016-03-10 ENCOUNTER — Emergency Department (HOSPITAL_COMMUNITY): Payer: 59

## 2016-03-10 DIAGNOSIS — F909 Attention-deficit hyperactivity disorder, unspecified type: Secondary | ICD-10-CM | POA: Insufficient documentation

## 2016-03-10 DIAGNOSIS — Y999 Unspecified external cause status: Secondary | ICD-10-CM | POA: Diagnosis not present

## 2016-03-10 DIAGNOSIS — F17200 Nicotine dependence, unspecified, uncomplicated: Secondary | ICD-10-CM | POA: Insufficient documentation

## 2016-03-10 DIAGNOSIS — Y939 Activity, unspecified: Secondary | ICD-10-CM | POA: Diagnosis not present

## 2016-03-10 DIAGNOSIS — S81011A Laceration without foreign body, right knee, initial encounter: Secondary | ICD-10-CM | POA: Insufficient documentation

## 2016-03-10 DIAGNOSIS — Y929 Unspecified place or not applicable: Secondary | ICD-10-CM | POA: Diagnosis not present

## 2016-03-10 DIAGNOSIS — W268XXA Contact with other sharp object(s), not elsewhere classified, initial encounter: Secondary | ICD-10-CM | POA: Diagnosis not present

## 2016-03-10 DIAGNOSIS — S81811A Laceration without foreign body, right lower leg, initial encounter: Secondary | ICD-10-CM

## 2016-03-10 MED ORDER — OXYCODONE-ACETAMINOPHEN 5-325 MG PO TABS
1.0000 | ORAL_TABLET | ORAL | Status: DC | PRN
  Administered 2016-03-10: 1 via ORAL
  Filled 2016-03-10: qty 1

## 2016-03-10 MED ORDER — IBUPROFEN 800 MG PO TABS: 800.0000 mg | ORAL_TABLET | Freq: Three times a day (TID) | ORAL | 0 refills | Status: DC | PRN

## 2016-03-10 MED ORDER — LIDOCAINE HCL (PF) 1 % IJ SOLN
5.0000 mL | Freq: Once | INTRAMUSCULAR | Status: DC
  Filled 2016-03-10: qty 30

## 2016-03-10 MED ORDER — TETANUS-DIPHTH-ACELL PERTUSSIS 5-2.5-18.5 LF-MCG/0.5 IM SUSP
0.5000 mL | Freq: Once | INTRAMUSCULAR | Status: AC
  Administered 2016-03-10: 0.5 mL via INTRAMUSCULAR
  Filled 2016-03-10: qty 0.5

## 2016-03-10 NOTE — Discharge Instructions (Signed)
As we discussed, please keep the area clean and dry and monitor for any signs of infection including redness, swelling, warmth, increased pain or purulent discharge.  Follow-up with your primary care provider and have your stitches removed in 7-10 days.

## 2016-03-10 NOTE — ED Provider Notes (Signed)
[facility]-EMERGENCY DEPT Provider Note   CSN: [id number] Arrival date & time: 03/10/16  1145   By signing my name below, I, Cynda Acres, attest that this documentation has been prepared under the direction and in the presence of Mathews Robinsons, PA-C.  Electronically Signed: Cynda Acres, Scribe. 03/10/16. 3:54 PM.  History   Chief Complaint Chief Complaint  Patient presents with  . Laceration    HPI Comments: Evan Conner is a 21 y.o. male with a hx of ADHD, who presents to the Emergency Department complaining of a sudden-onset laceration to the right knee, s/p mechanical fall that that happened earlier today. Patient states he went into the building to put his toolbox away, when he slipped and cut his right knee on a screw. Patient had associated bleeding which is now well-contolled. Patient is ambulatory. Pressure applied pressure with a gauze to stop the bleeding. Last tetanus shot is unknown. Patient denies hitting his head, loss of consciousness, numbness, or tingling.   The history is provided by the patient. No language interpreter was used.    Past Medical History:  Diagnosis Date  . ADHD (attention deficit hyperactivity disorder)     There are no active problems to display for this patient.   Past Surgical History:  Procedure Laterality Date  . TONSILLECTOMY         Home Medications    Prior to Admission medications   Medication Sig Start Date End Date Taking? Authorizing Provider  amoxicillin (AMOXIL) 875 MG tablet Take 1 tablet (875 mg total) by mouth 2 (two) times daily. 02/23/16   Tommi Rumps, PA-C  amphetamine-dextroamphetamine (ADDERALL XR) 15 MG 24 hr capsule Take 15 mg by mouth every morning.    Historical Provider, MD  ibuprofen (ADVIL,MOTRIN) 800 MG tablet Take 1 tablet (800 mg total) by mouth every 8 (eight) hours as needed for moderate pain. 03/10/16   Georgiana Shore, PA-C  ondansetron (ZOFRAN ODT) 4 MG disintegrating tablet Take 1 tablet (4  mg total) by mouth every 8 (eight) hours as needed for nausea or vomiting. 02/23/16   Tommi Rumps, PA-C    Family History History reviewed. No pertinent family history.  Social History Social History  Substance Use Topics  . Smoking status: Current Every Day Smoker    Packs/day: 1.50  . Smokeless tobacco: Former Neurosurgeon  . Alcohol use Yes     Allergies   Coconut oil   Review of Systems Review of Systems  Constitutional: Negative for chills and fever.  HENT: Negative for ear pain and sore throat.   Eyes: Negative for pain and visual disturbance.  Respiratory: Negative for cough and shortness of breath.   Cardiovascular: Negative for chest pain and palpitations.  Gastrointestinal: Negative for abdominal pain and vomiting.  Genitourinary: Negative for dysuria and hematuria.  Musculoskeletal: Negative for arthralgias and back pain.  Skin: Positive for wound. Negative for color change and rash.       3 cm laceration to the right knee.  Neurological: Negative for seizures, syncope, weakness and numbness.  All other systems reviewed and are negative.    Physical Exam Updated Vital Signs BP 121/76 (BP Location: Left Arm)   Pulse 77   Temp 98.3 F (36.8 C) (Oral)   Resp 16   SpO2 100%   Physical Exam  Constitutional: He is oriented to person, place, and time. He appears well-developed.  Afebrile, non-toxic appearing, sitting comfortable in chair in no acute distress.    HENT:  Head: Normocephalic  and atraumatic.  Mouth/Throat: Oropharynx is clear and moist.  Eyes: Conjunctivae and EOM are normal. Pupils are equal, round, and reactive to light.  Neck: Normal range of motion. Neck supple.  Cardiovascular: Normal rate.   Pulmonary/Chest: Effort normal.  Abdominal: Soft. Bowel sounds are normal.  Musculoskeletal: Normal range of motion.  Strong pulse distally. Full range of motion with flexion and extension.   Neurological: He is alert and oriented to person, place,  and time.  Neurovascularly intact bilaterally.   Skin: Skin is warm and dry.  3 cm laceration to the right knee.   Psychiatric: He has a normal mood and affect.           ED Treatments / Results  DIAGNOSTIC STUDIES: Oxygen Saturation is 100% on RA, normal by my interpretation.    COORDINATION OF CARE: 3:53 PM Discussed treatment plan with pt at bedside and pt agreed to plan.  Labs (all labs ordered are listed, but only abnormal results are displayed) Labs Reviewed - No data to display  EKG  EKG Interpretation None       Radiology Dg Knee Complete 4 Views Right  Result Date: 03/10/2016 CLINICAL DATA:  Right knee injury status post fall EXAM: RIGHT KNEE - COMPLETE 4+ VIEW COMPARISON:  None. FINDINGS: No evidence of fracture, dislocation, or joint effusion. No evidence of arthropathy or other focal bone abnormality. Soft tissues are unremarkable. IMPRESSION: Negative. Electronically Signed   By: Elige Ko   On: 03/10/2016 13:30    Procedures Procedures  LACERATION REPAIR Performed by: Mathews Robinsons, PA-C Consent: Verbal consent obtained. Risks and benefits: risks, benefits and alternatives were discussed Patient identity confirmed: provided demographic data Time out performed prior to procedure Prepped and Draped in normal sterile fashion Wound explored Laceration Location: right knee Laceration Length: 3 cm No Foreign Bodies seen or palpated Anesthesia: local infiltration Local anesthetic: lidocaine  1% without epinephrine Anesthetic total: 5 ml Irrigation method: syringe Amount of cleaning: standard Skin closure: 3.0 prolene Number of sutures or staples: 5 Technique: simple interrupted and horizontal mattress Patient tolerance: Patient tolerated the procedure well with no immediate complications.   Medications Ordered in ED Medications  oxyCODONE-acetaminophen (PERCOCET/ROXICET) 5-325 MG per tablet 1 tablet (1 tablet Oral Given 03/10/16 1451)    lidocaine (PF) (XYLOCAINE) 1 % injection 5 mL (not administered)  Tdap (BOOSTRIX) injection 0.5 mL (0.5 mLs Intramuscular Given 03/10/16 1744)     Initial Impression / Assessment and Plan / ED Course  I have reviewed the triage vital signs and the nursing notes.  Pertinent labs & imaging results that were available during my care of the patient were reviewed by me and considered in my medical decision making (see chart for details).     Otherwise healthy 21 year old male presenting after fall and his knee and lacerating his right knee on a screw. See above pictures. Exam otherwise unremarkable. He did not hit his head or lose consciousness. No other complaints. Patient's pain was controlled while in the emergency department. Tetanus updated while in ED  X-ray negative for any fractures, foreign bodies and soft tissue were unremarkable on x-ray. He has full range of motion of the knee no swelling. Discharge home with close PCP follow-up and suture removal in 10 days.  Discussed strict return precautions. Patient was advised to return to the emergency department if experiencing any new or worsening symptoms. Patient clearly understood instructions and agreed with discharge plan.  Final Clinical Impressions(s) / ED Diagnoses   Final diagnoses:  Laceration of right lower extremity, initial encounter    New Prescriptions Discharge Medication List as of 03/10/2016  5:14 PM    START taking these medications   Details  ibuprofen (ADVIL,MOTRIN) 800 MG tablet Take 1 tablet (800 mg total) by mouth every 8 (eight) hours as needed for moderate pain., Starting Sat 03/10/2016, Print       I personally performed the services described in this documentation, which was scribed in my presence. The recorded information has been reviewed and is accurate.     Georgiana ShoreJessica B Mitchell, PA-C 03/10/16 2010    Raeford RazorStephen Kohut, MD 03/19/16 [phone]

## 2016-03-10 NOTE — ED Notes (Signed)
ED Provider at bedside suturing pt 

## 2016-03-10 NOTE — ED Triage Notes (Signed)
Pt reports he slipped and cut R knee on screw. 2" laceration noted to R knee cap. Last tetanus unknown.

## 2016-03-13 ENCOUNTER — Emergency Department
Admission: EM | Admit: 2016-03-13 | Discharge: 2016-03-14 | Disposition: A | Discharge status: Home / Self Care | Payer: 59 | Attending: Emergency Medicine | Admitting: Emergency Medicine

## 2016-03-13 ENCOUNTER — Emergency Department: Payer: 59

## 2016-03-13 ENCOUNTER — Encounter: Payer: Self-pay | Admitting: *Deleted

## 2016-03-13 DIAGNOSIS — F909 Attention-deficit hyperactivity disorder, unspecified type: Secondary | ICD-10-CM | POA: Diagnosis not present

## 2016-03-13 DIAGNOSIS — M25561 Pain in right knee: Secondary | ICD-10-CM | POA: Diagnosis present

## 2016-03-13 DIAGNOSIS — F1290 Cannabis use, unspecified, uncomplicated: Secondary | ICD-10-CM | POA: Insufficient documentation

## 2016-03-13 DIAGNOSIS — L03115 Cellulitis of right lower limb: Secondary | ICD-10-CM | POA: Insufficient documentation

## 2016-03-13 DIAGNOSIS — F17200 Nicotine dependence, unspecified, uncomplicated: Secondary | ICD-10-CM | POA: Diagnosis not present

## 2016-03-13 DIAGNOSIS — Z79899 Other long term (current) drug therapy: Secondary | ICD-10-CM | POA: Diagnosis not present

## 2016-03-13 LAB — CBC WITH DIFFERENTIAL/PLATELET
Basophils Absolute: 0.1 10*3/uL (ref 0–0.1)
Basophils Relative: 1 %
Eosinophils Absolute: 0.4 10*3/uL (ref 0–0.7)
Eosinophils Relative: 5 %
HCT: 44.7 % (ref 40.0–52.0)
Hemoglobin: 15.1 g/dL (ref 13.0–18.0)
Lymphocytes Relative: 30 %
Lymphs Abs: 2.3 10*3/uL (ref 1.0–3.6)
MCH: 29.7 pg (ref 26.0–34.0)
MCHC: 33.8 g/dL (ref 32.0–36.0)
MCV: 87.8 fL (ref 80.0–100.0)
Monocytes Absolute: 1 10*3/uL (ref 0.2–1.0)
Monocytes Relative: 12 %
Neutro Abs: 4.1 10*3/uL (ref 1.4–6.5)
Neutrophils Relative %: 52 %
Platelets: 273 10*3/uL (ref 150–440)
RBC: 5.09 MIL/uL (ref 4.40–5.90)
RDW: 12.5 % (ref 11.5–14.5)
WBC: 7.8 10*3/uL (ref 3.8–10.6)

## 2016-03-13 LAB — COMPREHENSIVE METABOLIC PANEL
ALT: 45 U/L (ref 17–63)
AST: 28 U/L (ref 15–41)
Albumin: 4.1 g/dL (ref 3.5–5.0)
Alkaline Phosphatase: 58 U/L (ref 38–126)
Anion gap: 7 (ref 5–15)
BUN: 21 mg/dL — ABNORMAL HIGH (ref 6–20)
CO2: 28 mmol/L (ref 22–32)
Calcium: 9.3 mg/dL (ref 8.9–10.3)
Chloride: 103 mmol/L (ref 101–111)
Creatinine, Ser: 0.86 mg/dL (ref 0.61–1.24)
GFR calc Af Amer: >60 mL/min (ref >60)
GFR calc non Af Amer: >60 mL/min (ref >60)
Glucose, Bld: 97 mg/dL (ref 65–99)
Potassium: 4 mmol/L (ref 3.5–5.1)
Sodium: 138 mmol/L (ref 135–145)
Total Bilirubin: 0.7 mg/dL (ref 0.3–1.2)
Total Protein: 7.8 g/dL (ref 6.5–8.1)

## 2016-03-13 LAB — LACTIC ACID, PLASMA: Lactic Acid, Venous: 0.7 mmol/L (ref 0.5–1.9)

## 2016-03-13 LAB — SEDIMENTATION RATE: Sed Rate: 7 mm/hr (ref 0–15)

## 2016-03-13 MED ORDER — OXYCODONE-ACETAMINOPHEN 5-325 MG PO TABS
1.0000 | ORAL_TABLET | ORAL | Status: DC | PRN
  Administered 2016-03-13: 1 via ORAL
  Filled 2016-03-13: qty 1

## 2016-03-13 MED ORDER — ERYTHROMYCIN 5 MG/GM OP OINT: 1.0000 "application " | TOPICAL_OINTMENT | Freq: Four times a day (QID) | OPHTHALMIC | 0 refills | Status: DC

## 2016-03-13 MED ORDER — CLINDAMYCIN HCL 300 MG PO CAPS: 300.0000 mg | ORAL_CAPSULE | Freq: Three times a day (TID) | ORAL | 0 refills | Status: AC

## 2016-03-13 MED ORDER — CLINDAMYCIN HCL 150 MG PO CAPS
600.0000 mg | ORAL_CAPSULE | Freq: Once | ORAL | Status: AC
  Administered 2016-03-13: 600 mg via ORAL
  Filled 2016-03-13: qty 4

## 2016-03-13 MED ORDER — OXYCODONE-ACETAMINOPHEN 5-325 MG PO TABS: 1.0000 | ORAL_TABLET | Freq: Three times a day (TID) | ORAL | 0 refills | Status: AC | PRN

## 2016-03-13 NOTE — ED Triage Notes (Signed)
Pt presents w/ redness, swelling, and increased pain to R knee after cutting knee on a screw and having it repaired w/ sutures. Pt states drainage that is white, creamy pink drainage.

## 2016-03-13 NOTE — ED Provider Notes (Signed)
[facility] Emergency Department Provider Note  ____________________________________________  Time seen: Approximately 10:29 PM  I have reviewed the triage vital signs and the nursing notes.   HISTORY  Chief Complaint Wound Check    HPI Evan Conner is a 21 y.o. male presenting to the emergency department with 10 out of 10 right knee pain. Patient states that he underwent laceration repair at the emergency department at [facility] on 03/10/2016  after slipping and falling against a screw. Patient states that initially, he was recovering from laceration repair well. However, on 03/11/16, patient noticed increased knee pain and stiffness. Patient states that knee pain and stiffness have intensified over time. Patient states that he no longer can ambulate without extreme pain. Patient has also noticed erythema of the skin overlying the right knee. Patient has been afebrile. Patient denies chest pain, chest tightness, shortness of breath, abdominal pain, nausea and vomiting. Patient was not discharged from the emergency department at [facility] with antibiotics. No alleviating measures have been attempted.    Past Medical History:  Diagnosis Date  . ADHD (attention deficit hyperactivity disorder)     There are no active problems to display for this patient.   Past Surgical History:  Procedure Laterality Date  . TONSILLECTOMY      Prior to Admission medications   Medication Sig Start Date End Date Taking? Authorizing Provider  amoxicillin (AMOXIL) 875 MG tablet Take 1 tablet (875 mg total) by mouth 2 (two) times daily. 02/23/16   Tommi Rumps, PA-C  amphetamine-dextroamphetamine (ADDERALL XR) 15 MG 24 hr capsule Take 15 mg by mouth every morning.    Historical Provider, MD  clindamycin (CLEOCIN) 300 MG capsule Take 1 capsule (300 mg total) by mouth 3 (three) times daily. 03/13/16 03/23/16  Orvil Feil, PA-C  ibuprofen (ADVIL,MOTRIN) 800 MG tablet Take 1  tablet (800 mg total) by mouth every 8 (eight) hours as needed for moderate pain. 03/10/16   Georgiana Shore, PA-C  ondansetron (ZOFRAN ODT) 4 MG disintegrating tablet Take 1 tablet (4 mg total) by mouth every 8 (eight) hours as needed for nausea or vomiting. 02/23/16   Tommi Rumps, PA-C  oxyCODONE-acetaminophen (ROXICET) 5-325 MG tablet Take 1 tablet by mouth every 8 (eight) hours as needed for severe pain. 03/13/16 03/16/16  Orvil Feil, PA-C    Allergies Coconut oil  History reviewed. No pertinent family history.  Social History Social History  Substance Use Topics  . Smoking status: Current Every Day Smoker    Packs/day: 1.50  . Smokeless tobacco: Former Neurosurgeon  . Alcohol use Yes     Review of Systems  Constitutional: No fever/chills Eyes: No visual changes. No discharge ENT: No upper respiratory complaints. Cardiovascular: no chest pain. Respiratory: no cough. No SOB. Gastrointestinal: No abdominal pain.  No nausea, no vomiting.  No diarrhea.  No constipation. Musculoskeletal: Patient has right knee pain.  Skin: Patient has erythema of the skin overlying the right knee. Patient has repaired laceration of the skin overlying the right knee. Neurological: Negative for headaches, focal weakness or numbness.  ____________________________________________   PHYSICAL EXAM:  VITAL SIGNS: ED Triage Vitals  Enc Vitals Group     BP 03/13/16 2056 138/82     Pulse Rate 03/13/16 2056 85     Resp 03/13/16 2056 18     Temp 03/13/16 2056 98.1 F (36.7 C)     Temp Source 03/13/16 2056 Oral     SpO2 03/13/16 2056 98 %  Weight --      Height --      Head Circumference --      Peak Flow --      Pain Score 03/13/16 2058 8     Pain Loc --      Pain Edu? --      Excl. in GC? --     Constitutional: Alert and oriented. Well appearing and in no acute distress. Eyes: Conjunctivae are normal. PERRL. EOMI. Head: Atraumatic. Hematological/Lymphatic/Immunilogical: No cervical  lymphadenopathy. Cardiovascular: Normal rate, regular rhythm. Normal S1 and S2.  Good peripheral circulation. Respiratory: Normal respiratory effort without tachypnea or retractions. Lungs CTAB. Good air entry to the bases with no decreased or absent breath sounds. Musculoskeletal: Patient has 5 out of 5 strength in the lower extremities bilaterally. Right knee: Peripatellar dimpling visualized. Right knee does not appear effused. Patient is able to perform flexion and extension at the knee with pain. Patient has tenderness elicited with palpation along the medial joint line. Palpable dorsalis pedis pulse bilaterally and symmetrically. Neurologic:  Normal speech and language. No gross focal neurologic deficits are appreciated.  Skin:  Skin overlying the right knee is mildly erythematous. No streaking visualized. No purulent exudate could be expressed from the wound. Skin overlying the right knee is exquisitely tender to palpation. Psychiatric: Mood and affect are normal. Speech and behavior are normal. Patient exhibits appropriate insight and judgement. ____________________________________________   LABS (all labs ordered are listed, but only abnormal results are displayed)  Labs Reviewed  COMPREHENSIVE METABOLIC PANEL - Abnormal; Notable for the following:       Result Value   BUN 21 (*)    All other components within normal limits  CULTURE, BLOOD (ROUTINE X 2)  CULTURE, BLOOD (ROUTINE X 2)  CBC WITH DIFFERENTIAL/PLATELET  SEDIMENTATION RATE  LACTIC ACID, PLASMA  C-REACTIVE PROTEIN   ____________________________________________  EKG   ____________________________________________  RADIOLOGY Geraldo PitterI, Prue Lingenfelter M Odilia Damico, personally viewed and evaluated these images (plain radiographs) as part of my medical decision making, as well as reviewing the written report by the radiologist.  Dg Knee Complete 4 Views Right  Result Date: 03/13/2016 CLINICAL DATA:  Status post fall onto screw, with  worsening right anterior knee pain. Initial encounter. EXAM: RIGHT KNEE - COMPLETE 4+ VIEW COMPARISON:  Right knee radiographs performed 03/10/2016 FINDINGS: There is no evidence of fracture or dislocation. The joint spaces are preserved. No significant degenerative change is seen; the patellofemoral joint is grossly unremarkable in appearance. No significant joint effusion is seen. The visualized soft tissues are normal in appearance. IMPRESSION: No evidence of fracture or dislocation. Electronically Signed   By: Roanna RaiderJeffery  Chang M.D.   On: 03/13/2016 22:18    ____________________________________________    PROCEDURES  Procedure(s) performed:    Procedures    Medications  oxyCODONE-acetaminophen (PERCOCET/ROXICET) 5-325 MG per tablet 1 tablet (1 tablet Oral Given 03/13/16 2117)  clindamycin (CLEOCIN) capsule 600 mg (600 mg Oral Given 03/13/16 2329)     ____________________________________________   INITIAL IMPRESSION / ASSESSMENT AND PLAN / ED COURSE  Pertinent labs & imaging results that were available during my care of the patient were reviewed by me and considered in my medical decision making (see chart for details).  Review of the [place] CSRS was performed in accordance of the [facility] prior to dispensing any controlled drugs.     Assessment and Plan: Right Knee Cellulitis:  Patient presents to the emergency department with right knee pain and mild erythema of the skin overlying  the right knee. Physical exam findings are consistent with cellulitis. DG right knee reveals no foreign bodies. CBC, BMP, lactic acid and sedimentation rate are reassuring at this time. Patient was started on clindamycin in the emergency department. Patient was discharged with clindamycin and a short course of Roxicet for pain. Patient was advised to follow-up with wound care in 2 days. Patient was advised to return to the emergency department immediately if symptoms acutely worsen. Strict return precautions  were given 3 times. All patient questions were answered.  ___________________________________________  FINAL CLINICAL IMPRESSION(S) / ED DIAGNOSES  Final diagnoses:  Foreign body of left eye, initial encounter      NEW MEDICATIONS STARTED DURING THIS VISIT:  New Prescriptions   CLINDAMYCIN (CLEOCIN) 300 MG CAPSULE    Take 1 capsule (300 mg total) by mouth 3 (three) times daily.   OXYCODONE-ACETAMINOPHEN (ROXICET) 5-325 MG TABLET    Take 1 tablet by mouth every 8 (eight) hours as needed for severe pain.        This chart was dictated using voice recognition software/Dragon. Despite best efforts to proofread, errors can occur which can change the meaning. Any change was purely unintentional.    Orvil Feil, PA-C 03/14/16 0019    Governor Rooks, MD 03/14/16 [phone]

## 2016-03-14 LAB — C-REACTIVE PROTEIN: CRP: 2.9 mg/dL — ABNORMAL HIGH (ref <1.0)

## 2016-03-18 LAB — CULTURE, BLOOD (ROUTINE X 2): Culture: NO GROWTH

## 2016-08-31 ENCOUNTER — Observation Stay (HOSPITAL_COMMUNITY)
Admission: EM | Admit: 2016-08-31 | Discharge: 2016-08-31 | Discharge status: Against Medical Advice | Payer: 59 | Attending: Cardiology | Admitting: Cardiology

## 2016-08-31 ENCOUNTER — Observation Stay (HOSPITAL_COMMUNITY): Payer: 59

## 2016-08-31 ENCOUNTER — Emergency Department (HOSPITAL_BASED_OUTPATIENT_CLINIC_OR_DEPARTMENT_OTHER): Payer: 59

## 2016-08-31 ENCOUNTER — Encounter (HOSPITAL_COMMUNITY): Payer: Self-pay | Admitting: Student

## 2016-08-31 ENCOUNTER — Emergency Department (HOSPITAL_COMMUNITY): Payer: 59

## 2016-08-31 DIAGNOSIS — I301 Infective pericarditis: Secondary | ICD-10-CM

## 2016-08-31 DIAGNOSIS — Z8249 Family history of ischemic heart disease and other diseases of the circulatory system: Secondary | ICD-10-CM | POA: Diagnosis not present

## 2016-08-31 DIAGNOSIS — R748 Abnormal levels of other serum enzymes: Secondary | ICD-10-CM

## 2016-08-31 DIAGNOSIS — R509 Fever, unspecified: Secondary | ICD-10-CM | POA: Diagnosis not present

## 2016-08-31 DIAGNOSIS — R079 Chest pain, unspecified: Secondary | ICD-10-CM

## 2016-08-31 DIAGNOSIS — R778 Other specified abnormalities of plasma proteins: Secondary | ICD-10-CM

## 2016-08-31 DIAGNOSIS — I309 Acute pericarditis, unspecified: Secondary | ICD-10-CM | POA: Diagnosis not present

## 2016-08-31 DIAGNOSIS — R0789 Other chest pain: Secondary | ICD-10-CM

## 2016-08-31 DIAGNOSIS — F17210 Nicotine dependence, cigarettes, uncomplicated: Secondary | ICD-10-CM | POA: Diagnosis not present

## 2016-08-31 DIAGNOSIS — R7989 Other specified abnormal findings of blood chemistry: Secondary | ICD-10-CM

## 2016-08-31 DIAGNOSIS — I514 Myocarditis, unspecified: Secondary | ICD-10-CM | POA: Diagnosis not present

## 2016-08-31 LAB — CREATININE, SERUM: CREATININE: 0.86 mg/dL (ref 0.61–1.24)

## 2016-08-31 LAB — ECHOCARDIOGRAM COMPLETE
AVLVOTPG: 2 mmHg
CHL CUP MV DEC (S): 162
EERAT: 5.43
EWDT: 162 ms
FS: 28 % (ref 28–44)
Height: 72 in
IV/PV OW: 1.04
LA diam end sys: 44 mm
LA vol A4C: 90.5 ml
LA vol: 80 mL
LADIAMINDEX: 1.85 cm/m2
LASIZE: 44 mm
LAVOLIN: 33.6 mL/m2
LV e' LATERAL: 15 cm/s
LVEEAVG: 5.43
LVEEMED: 5.43
LVOT SV: 97 mL
LVOT VTI: 18.3 cm
LVOT area: 5.31 cm2
LVOT diameter: 26 mm
LVOT peak vel: 76.1 cm/s
Lateral S' vel: 8.49 cm/s
MV pk A vel: 53.4 m/s
MVPG: 3 mmHg
MVPKEVEL: 81.5 m/s
PW: 10.7 mm — AB (ref 0.6–1.1)
RV TAPSE: 19.9 mm
TDI e' lateral: 15
TDI e' medial: 11.2
Weight: 4160 oz

## 2016-08-31 LAB — CBC
HEMATOCRIT: 42.5 % (ref 39.0–52.0)
HEMATOCRIT: 41.7 % (ref 39.0–52.0)
HEMOGLOBIN: 14 g/dL (ref 13.0–17.0)
HEMOGLOBIN: 13.9 g/dL (ref 13.0–17.0)
MCH: 28.2 pg (ref 26.0–34.0)
MCH: 28.6 pg (ref 26.0–34.0)
MCHC: 32.9 g/dL (ref 30.0–36.0)
MCHC: 33.3 g/dL (ref 30.0–36.0)
MCV: 85.7 fL (ref 78.0–100.0)
MCV: 85.8 fL (ref 78.0–100.0)
Platelets: 194 10*3/uL (ref 150–400)
Platelets: 206 10*3/uL (ref 150–400)
RBC: 4.96 MIL/uL (ref 4.22–5.81)
RBC: 4.86 MIL/uL (ref 4.22–5.81)
RDW: 12.1 % (ref 11.5–15.5)
RDW: 12.2 % (ref 11.5–15.5)
WBC: 6.3 10*3/uL (ref 4.0–10.5)
WBC: 5.2 10*3/uL (ref 4.0–10.5)

## 2016-08-31 LAB — DIFFERENTIAL
BASOS PCT: 1 %
Basophils Absolute: 0 10*3/uL (ref 0.0–0.1)
EOS PCT: 3 %
Eosinophils Absolute: 0.2 10*3/uL (ref 0.0–0.7)
LYMPHS PCT: 23 %
Lymphs Abs: 1.4 10*3/uL (ref 0.7–4.0)
Monocytes Absolute: 0.9 10*3/uL (ref 0.1–1.0)
Monocytes Relative: 14 %
NEUTROS PCT: 59 %
Neutro Abs: 3.7 10*3/uL (ref 1.7–7.7)

## 2016-08-31 LAB — HEPATIC FUNCTION PANEL
ALT: 39 U/L (ref 17–63)
AST: 44 U/L — AB (ref 15–41)
Albumin: 3.3 g/dL — ABNORMAL LOW (ref 3.5–5.0)
Alkaline Phosphatase: 44 U/L (ref 38–126)
BILIRUBIN DIRECT: 0.2 mg/dL (ref 0.1–0.5)
BILIRUBIN INDIRECT: 0.7 mg/dL (ref 0.3–0.9)
Total Bilirubin: 0.9 mg/dL (ref 0.3–1.2)
Total Protein: 6.2 g/dL — ABNORMAL LOW (ref 6.5–8.1)

## 2016-08-31 LAB — URINALYSIS, ROUTINE W REFLEX MICROSCOPIC
BILIRUBIN URINE: NEGATIVE
GLUCOSE, UA: NEGATIVE mg/dL
HGB URINE DIPSTICK: NEGATIVE
KETONES UR: NEGATIVE mg/dL
LEUKOCYTES UA: NEGATIVE
Nitrite: NEGATIVE
PROTEIN: NEGATIVE mg/dL
Specific Gravity, Urine: 1.012 (ref 1.005–1.030)
pH: 6 (ref 5.0–8.0)

## 2016-08-31 LAB — RAPID URINE DRUG SCREEN, HOSP PERFORMED
Amphetamines: NOT DETECTED
BENZODIAZEPINES: NOT DETECTED
Barbiturates: NOT DETECTED
COCAINE: NOT DETECTED
OPIATES: NOT DETECTED
Tetrahydrocannabinol: POSITIVE — AB

## 2016-08-31 LAB — I-STAT TROPONIN, ED: Troponin i, poc: 1.81 ng/mL (ref 0.00–0.08)

## 2016-08-31 LAB — D-DIMER, QUANTITATIVE (NOT AT ARMC): D DIMER QUANT: 0.7 ug{FEU}/mL — AB (ref 0.00–0.50)

## 2016-08-31 LAB — BASIC METABOLIC PANEL
ANION GAP: 7 (ref 5–15)
BUN: 12 mg/dL (ref 6–20)
CO2: 22 mmol/L (ref 22–32)
Calcium: 8.4 mg/dL — ABNORMAL LOW (ref 8.9–10.3)
Chloride: 107 mmol/L (ref 101–111)
Creatinine, Ser: 0.92 mg/dL (ref 0.61–1.24)
GFR calc Af Amer: >60 mL/min (ref >60)
GFR calc non Af Amer: >60 mL/min (ref >60)
GLUCOSE: 98 mg/dL (ref 65–99)
POTASSIUM: 3.7 mmol/L (ref 3.5–5.1)
Sodium: 136 mmol/L (ref 135–145)

## 2016-08-31 LAB — C-REACTIVE PROTEIN: CRP: 2.7 mg/dL — AB (ref <1.0)

## 2016-08-31 LAB — SEDIMENTATION RATE: Sed Rate: 10 mm/hr (ref 0–16)

## 2016-08-31 LAB — TROPONIN I
TROPONIN I: 2.73 ng/mL — AB (ref <0.03)
TROPONIN I: 1.76 ng/mL — AB (ref <0.03)

## 2016-08-31 LAB — I-STAT CG4 LACTIC ACID, ED: LACTIC ACID, VENOUS: 1.34 mmol/L (ref 0.5–1.9)

## 2016-08-31 MED ORDER — ONDANSETRON HCL 4 MG/2ML IJ SOLN
4.0000 mg | Freq: Once | INTRAMUSCULAR | Status: AC
  Administered 2016-08-31: 4 mg via INTRAVENOUS
  Filled 2016-08-31: qty 2

## 2016-08-31 MED ORDER — ASPIRIN 81 MG PO CHEW: 324.0000 mg | CHEWABLE_TABLET | Freq: Once | ORAL | Status: DC

## 2016-08-31 MED ORDER — ASPIRIN 325 MG PO TABS
650.0000 mg | ORAL_TABLET | Freq: Four times a day (QID) | ORAL | Status: DC
  Administered 2016-08-31: 650 mg via ORAL
  Filled 2016-08-31: qty 2

## 2016-08-31 MED ORDER — ACETAMINOPHEN 500 MG PO TABS
1000.0000 mg | ORAL_TABLET | Freq: Once | ORAL | Status: AC
  Administered 2016-08-31: 1000 mg via ORAL
  Filled 2016-08-31: qty 2

## 2016-08-31 MED ORDER — PANTOPRAZOLE SODIUM 40 MG PO TBEC
40.0000 mg | DELAYED_RELEASE_TABLET | Freq: Every day | ORAL | Status: DC
  Administered 2016-08-31: 40 mg via ORAL
  Filled 2016-08-31: qty 1

## 2016-08-31 MED ORDER — COLCHICINE 0.6 MG PO TABS
0.6000 mg | ORAL_TABLET | Freq: Two times a day (BID) | ORAL | Status: DC
  Administered 2016-08-31: 0.6 mg via ORAL
  Filled 2016-08-31: qty 1

## 2016-08-31 MED ORDER — IOPAMIDOL (ISOVUE-370) INJECTION 76%
INTRAVENOUS | Status: AC
  Administered 2016-08-31: 100 mL
  Filled 2016-08-31: qty 100

## 2016-08-31 MED ORDER — ACETAMINOPHEN 325 MG PO TABS
650.0000 mg | ORAL_TABLET | ORAL | Status: DC | PRN
  Filled 2016-08-31: qty 2

## 2016-08-31 MED ORDER — ENOXAPARIN SODIUM 40 MG/0.4ML ~~LOC~~ SOLN
40.0000 mg | SUBCUTANEOUS | Status: DC
  Filled 2016-08-31: qty 0.4

## 2016-08-31 MED ORDER — NITROGLYCERIN 0.4 MG SL SUBL
0.4000 mg | SUBLINGUAL_TABLET | SUBLINGUAL | Status: DC | PRN
  Administered 2016-08-31: 0.4 mg via SUBLINGUAL
  Filled 2016-08-31: qty 1

## 2016-08-31 MED ORDER — ASPIRIN EC 81 MG PO TBEC: 81.0000 mg | DELAYED_RELEASE_TABLET | Freq: Every day | ORAL | Status: DC

## 2016-08-31 MED ORDER — SODIUM CHLORIDE 0.9 % IV BOLUS (SEPSIS)
1000.0000 mL | Freq: Once | INTRAVENOUS | Status: AC
  Administered 2016-08-31: 1000 mL via INTRAVENOUS

## 2016-08-31 MED ORDER — GADOBENATE DIMEGLUMINE 529 MG/ML IV SOLN
39.0000 mL | Freq: Once | INTRAVENOUS | Status: AC | PRN
  Administered 2016-08-31: 39 mL via INTRAVENOUS

## 2016-08-31 MED ORDER — ONDANSETRON HCL 4 MG/2ML IJ SOLN: 4.0000 mg | Freq: Four times a day (QID) | INTRAMUSCULAR | Status: DC | PRN

## 2016-08-31 NOTE — Progress Notes (Signed)
Pt is alert and oriented smoking in room after stating he has cp, EKG done  and PRN given MD aware.

## 2016-08-31 NOTE — ED Notes (Signed)
Pt ambulated self efficiently to restroom with no difficulty. 

## 2016-08-31 NOTE — Progress Notes (Signed)
  Echocardiogram 2D Echocardiogram has been performed.  Arvil ChacoFoster, Issis Lindseth 08/31/2016, 9:50 AM

## 2016-08-31 NOTE — ED Provider Notes (Signed)
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 States he felt like an elephant was sitting on his chest. States he felt short of breath and then it was hard to take a deep breath because it hurt. He has had 3 days of vomiting, diffuse headache and fever of 101. No cough, sore throat, diarrhea, rash. He has had multiple recent tick bites. States he smokes marijuana but does not use any stimulants. Does have history of hypertension and tobacco use. Does have a family history of a father who had multiple MIs, his first was at 21 years old. Patient denies history of PE or DVT. No sick contacts or recent international travel. Patient denies any previous history of cardiac disease for himself.  Given aspirin and nitroglycerin 3 with EMS. Reports nitroglycerin did improve his pain.   ROS: See HPI Constitutional:  fever  Eyes: no drainage  ENT: no runny nose   Cardiovascular:  chest pain  Resp: SOB  GI:  vomiting GU: no dysuria Integumentary: no rash  Allergy: no hives  Musculoskeletal: no leg swelling  Neurological: no slurred speech ROS otherwise negative  PAST MEDICAL HISTORY/PAST SURGICAL HISTORY:  Past Medical History:  Diagnosis Date  . ADHD (attention deficit hyperactivity disorder)     MEDICATIONS:  Prior to Admission medications   Medication Sig Start Date End Date Taking? Authorizing Provider  amoxicillin (AMOXIL) 875 MG tablet Take 1 tablet (875 mg total) by mouth 2 (two) times daily. 02/23/16   Tommi RumpsSummers, Rhonda L, PA-C  amphetamine-dextroamphetamine (ADDERALL XR) 15 MG 24 hr capsule Take 15 mg by mouth every morning.    [provider]  ibuprofen (ADVIL,MOTRIN) 800 MG tablet Take 1 tablet (800 mg total) by mouth every 8 (eight) hours as needed for moderate pain.  03/10/16   Mathews RobinsonsMitchell, Jessica B, PA-C  ondansetron (ZOFRAN ODT) 4 MG disintegrating tablet Take 1 tablet (4 mg total) by mouth every 8 (eight) hours as needed for nausea or vomiting. 02/23/16   Tommi RumpsSummers, Rhonda L, PA-C    ALLERGIES:  Allergies  Allergen Reactions  . Coconut Oil     Throat swells    SOCIAL HISTORY:  Social History  Substance Use Topics  . Smoking status: Current Every Day Smoker    Packs/day: 1.50  . Smokeless tobacco: Former NeurosurgeonUser  . Alcohol use Yes    FAMILY HISTORY: No family history on file.  EXAM: BP 105/61   Pulse 84   Temp 100 F (37.8 C) (Oral)   Ht 6' (1.829 m)   Wt 117.9 kg (260 lb)   SpO2 100%   BMI 35.26 kg/m  CONSTITUTIONAL: Alert and oriented and responds appropriately to questions. Obese, in no distress, febrile HEAD: Normocephalic EYES: Conjunctivae clear, pupils appear equal, EOMI ENT: normal nose; moist mucous membranes; No pharyngeal erythema or petechiae, no tonsillar hypertrophy or exudate, no uvular deviation, no unilateral swelling, no trismus or drooling, no muffled voice, normal phonation, no stridor, no dental caries present, no drainable dental abscess noted, no Ludwig's angina, tongue sits flat in the bottom of the mouth, no angioedema, no facial erythema or warmth, no facial swelling; no pain with movement of the neck. NECK: Supple, no meningismus, no nuchal rigidity, no LAD  CARD: RRR; S1 and S2 appreciated; no murmurs, no clicks, no  rubs, no gallops RESP: Normal chest excursion without splinting or tachypnea; breath sounds clear and equal bilaterally; no wheezes, no rhonchi, no rales, no hypoxia or respiratory distress, speaking full sentences ABD/GI: Normal bowel sounds; non-distended; soft, non-tender, no rebound, no guarding, no peritoneal signs, no hepatosplenomegaly BACK:  The back appears normal and is non-tender to palpation, there is no CVA tenderness EXT: Normal ROM in all joints; non-tender to palpation; no edema; normal  capillary refill; no cyanosis, no calf tenderness or swelling    SKIN: Normal color for age and race; warm; no rash; no rash on the palms, soles or mucous membranes; no petechiae or purpura; no bull's-eye rash; no cellulitis NEURO: Moves all extremities equally PSYCH: The patient's mood and manner are appropriate. Grooming and personal hygiene are appropriate.  MEDICAL DECISION MAKING: Patient here with complaints of chest pain. EKG shows no ischemic abnormality. Troponin has come back elevated. I'm concerned for possible viral myocarditis. He is having pleuritic pain. Also concerned that this could be right heart strain from a pulmonary embolus but he is hemodynamically stable. Will obtain d-dimer. Chest x-ray is clear. He declines pain medication given his previous history of opiate abuse.  ED PROGRESS: Patient's d-dimer is elevated. Will proceed with CT scan. Urine drug positive for THC but otherwise negative. Lactate normal. Urine shows no sign of infection. Blood cultures are pending.  6:40 AM  Patient CT scan shows no pulmonary embolus or other acute abnormality. [facility]   7:09 AM  D/w Dr. Mayford Knifeurner with cardiology. They will see the patient. She agrees with holding on IV heparin at this time as this is likely viral myocarditis. Patient is resting comfortably. He still having mild pain but has declined further medication.  I reviewed all nursing notes, vitals, pertinent previous records, EKGs, lab and urine results, imaging (as available).    EKG Interpretation  Date/Time:  Friday August 31 2016 04:05:13 EDT Ventricular Rate:  78 PR Interval:    QRS Duration: 113 QT Interval:  377 QTC Calculation: 430 R Axis:   91 Text Interpretation:  Sinus rhythm Borderline intraventricular conduction delay Borderline T wave abnormalities Baseline wander in lead(s) V1 No significant change since last tracing Confirmed by Zaelynn Fuchs, Baxter HireKristen [phone]([id number]) on 08/31/2016 4:08:02 AM         CRITICAL CARE Performed by: Raelyn NumberWARD, Namish Krise N   Total critical care time: 45 minutes  Critical care time was exclusive of separately billable procedures and treating other patients.  Critical care was necessary to treat or prevent imminent or life-threatening deterioration.  Critical care was time spent personally by me on the following activities: development of treatment plan with patient and/or surrogate as well as nursing, discussions with consultants, evaluation of patient's response to treatment, examination of patient, obtaining history from patient or surrogate, ordering and performing treatments and interventions, ordering and review of laboratory studies, ordering and review of radiographic studies, pulse oximetry and re-evaluation of patient's condition.    Samanvi Cuccia, Layla MawKristen N, DO 08/31/16 [phone]

## 2016-08-31 NOTE — ED Triage Notes (Signed)
Patient called out for CP with painful inspiration that began one hour ago.  Denies N'/V. Had 324 asa and 3 SL nitro enroute.

## 2016-08-31 NOTE — Progress Notes (Signed)
Paged attending MD d/t patient and family question and concerns.

## 2016-08-31 NOTE — H&P (Signed)
History & Physical    Patient ID: Evan Conner MRN: [id number], DOB/AGE: Jun 21, 1995   Admit date: 08/31/2016   Primary Physician: Kari Baars, MD Primary Cardiologist: [facility] - Dr. Mayford Knife   History of Present Illness    Evan Conner is a 21 y.o. male with past medical history of ADHD, prior opiate addiction, tobacco use, and family history of premature CAD who presents to [facility] ED on 08/31/2016 for evaluation of chest pain.   He reports having nausea, vomiting, and a fever for the past 3 days. Starting yesterday, he developed a tightness along his chest which was exacerbated by deep breathing. Cannot lay flat without chest pain. Notes associated dyspnea, diaphoresis, and nausea. Around 0200, he was watching a movie and the pain acutely worsened and he reports it felt like an elephant was sitting on his chest. His girlfriend reports he was crying secondary to the intensity of the pain and she called EMS. He was given 3 SL NTG en route to the hospital and denies any recurrent pain at this time.    He denies any prior history of chest pain or dyspnea on exertion. 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 He does have a strong family history of CAD with his father having an MI at age 70 and multiple cousins having MI's in their 49's (does not believe they had stent placement). He has smoked 1-1.5 ppd since age 1. Consumes alcohol socially. Uses Marijuana a few times per month. He denies any cocaine or heroin use.   Initial labs show WBC of 6.3, Hgb 14.0, platelets 194. Na+ 136, K+ 3.7, creatinine 0.92. Initial troponin 2.73. D-dimer at 0.70. Lactic Acid 1.34. UDS positive for THC. CXR with no active cardiopulmonary disease. CTA showing no evidence of a PE. EKG shows NSR, HR 78, with no acute ST or T-wave changes.   Past Medical History    Past Medical History:  Diagnosis Date  . ADHD (attention deficit hyperactivity disorder)     Past Surgical  History:  Procedure Laterality Date  . TONSILLECTOMY       Allergies  Allergies  Allergen Reactions  . Coconut Oil     Throat swells     Home Medications    Prior to Admission medications   Medication Sig Start Date End Date Taking? Authorizing Provider  ibuprofen (ADVIL,MOTRIN) 800 MG tablet Take 1 tablet (800 mg total) by mouth every 8 (eight) hours as needed for moderate pain. 03/10/16  Yes Mathews Robinsons B, PA-C  ondansetron (ZOFRAN ODT) 4 MG disintegrating tablet Take 1 tablet (4 mg total) by mouth every 8 (eight) hours as needed for nausea or vomiting. 02/23/16  Yes Tommi Rumps, PA-C    Family History    Family History  Problem Relation Age of Onset  . CAD Father        1st MI at age 65    Social History    Social History   Social History  . Marital status: Single    Spouse name: N/A  . Number of children: N/A  . Years of education: N/A   Occupational History  . Not on file.   Social History Main Topics  . Smoking status: Current Every Day Smoker    Packs/day: 1.50  . Smokeless tobacco: Former Neurosurgeon  . Alcohol use Yes  . Drug use: Yes    Types: Marijuana  . Sexual activity: Not on file   Other Topics Concern  .  Not on file   Social History Narrative  . No narrative on file     Review of Systems    General:  No chills, night sweats or weight changes. Positive for fever.  Cardiovascular:  No dyspnea on exertion, edema, orthopnea, palpitations, paroxysmal nocturnal dyspnea. Positive for chest pain.  Dermatological: No rash, lesions/masses Respiratory: No cough, dyspnea Urologic: No hematuria, dysuria Abdominal:   No diarrhea, bright red blood per rectum, melena, or hematemesis. Positive for nausea and vomiting.  Neurologic:  No visual changes, wkns, changes in mental status.  All other systems reviewed and are otherwise negative except as noted above.  Physical Exam    Blood pressure (!) 103/54, pulse 64, temperature 100 F (37.8 C),  temperature source Oral, resp. rate 12, height 6' (1.829 m), weight 260 lb (117.9 kg), SpO2 98 %.  General: Well developed, well nourished Caucasian male in no acute distress. Head: Normocephalic, atraumatic, sclera non-icteric, no xanthomas, nares are without discharge. Dentition:  Neck: No carotid bruits. JVD not elevated.  Lungs: Respirations regular and unlabored, without wheezes or rales.  Heart: Regular rate and rhythm. No S3 or S4.  No murmur, no rubs, or gallops appreciated. Pain is not reproducible on palpation.  Abdomen: Soft, non-tender, non-distended with normoactive bowel sounds. No hepatomegaly. No rebound/guarding. No obvious abdominal masses. Msk:  Strength and tone appear normal for age. No joint deformities or effusions. Extremities: No clubbing or cyanosis. No lower extremity edema.  Distal pedal pulses are 2+ bilaterally. Neuro: Alert and oriented X 3. Moves all extremities spontaneously. No focal deficits noted. Psych:  Responds to questions appropriately with a normal affect. Skin: No rashes or lesions noted  Labs    Troponin [facility](Point of Care Test)  Recent Labs  08/31/16 0413  TROPIPOC 1.81*    Recent Labs  08/31/16 0411  TROPONINI 2.73*   Lab Results  Component Value Date   WBC 6.3 08/31/2016   HGB 14.0 08/31/2016   HCT 42.5 08/31/2016   MCV 85.7 08/31/2016   PLT 194 08/31/2016     Recent Labs Lab 08/31/16 0411 08/31/16 0453  NA 136  --   K 3.7  --   CL 107  --   CO2 22  --   BUN 12  --   CREATININE 0.92  --   CALCIUM 8.4*  --   PROT  --  6.2*  BILITOT  --  0.9  ALKPHOS  --  44  ALT  --  39  AST  --  44*  GLUCOSE 98  --    No results found for: CHOL, HDL, LDLCALC, TRIG Lab Results  Component Value Date   DDIMER 0.70 (H) 08/31/2016    No results found for: BNP No results found for: PROBNP No results for input(s): INR in the last 72 hours.    Radiology Studies    Dg Chest 2 View  Result Date: 08/31/2016 CLINICAL DATA:  Chest pain  and shortness of breath for 2 hours. History of hypertension. EXAM: CHEST  2 VIEW COMPARISON:  10/18/2014 FINDINGS: The heart size and mediastinal contours are within normal limits. Both lungs are clear. The visualized skeletal structures are unremarkable. IMPRESSION: No active cardiopulmonary disease. Electronically Signed   By: Burman NievesWilliam  Stevens M.D.   On: 08/31/2016 04:37   Ct Angio Chest Pe W And/or Wo Contrast  Result Date: 08/31/2016 CLINICAL DATA:  Mid chest pain with shortness of breath since 2 a.m. Positive D-dimer. EXAM: CT ANGIOGRAPHY CHEST WITH CONTRAST TECHNIQUE:  Multidetector CT imaging of the chest was performed using the standard protocol during bolus administration of intravenous contrast. Multiplanar CT image reconstructions and MIPs were obtained to evaluate the vascular anatomy. CONTRAST:  100 mL Isovue 370 COMPARISON:  05/19/2014 FINDINGS: Cardiovascular: Satisfactory opacification of the pulmonary arteries to the segmental level. No evidence of pulmonary embolism. Normal heart size. No pericardial effusion. Normal caliber thoracic aorta. Mediastinum/Nodes: No enlarged mediastinal, hilar, or axillary lymph nodes. Thyroid gland, trachea, and esophagus demonstrate no significant findings. Lungs/Pleura: Lungs are clear. No pleural effusion or pneumothorax. Upper Abdomen: No acute abnormality. Musculoskeletal: No chest wall abnormality. No acute or significant osseous findings. Review of the MIP images confirms the above findings. IMPRESSION: No evidence of significant pulmonary embolus. No evidence of active pulmonary disease. Electronically Signed   By: Burman NievesWilliam  Stevens M.D.   On: 08/31/2016 05:58    EKG & Cardiac Imaging    EKG:   NSR, HR 78, with no acute ST or T-wave changes.  - Personally Reviewed  ECHOCARDIOGRAM: Pending  Assessment & Plan   1. Chest Pain/ Elevated Troponin  - reports having a fever, nausea, and vomiting for the past 3 days. Starting yesterday, he developed a  tightness along his chest which was exacerbated by deep breathing and lying flat. Notes associated diaphoresis and nausea. Pain has since resolved but representing when at less than a 30 degree angle.  - Initial troponin 2.73. D-dimer at 0.70 with CTA showing no evidence of a PE. EKG shows NSR, HR 78, with no acute ST or T-wave changes.  - cycle cardiac enzymes. Check CRP and Sed Rate. Discussed with Dr. Mayford Knifeurner, will obtain a STAT Echo to rule-out any WMA. If echo is essentially normal, then plan for a cardiac MRI. Will start Colchicine 0.6mg  BID for likely Pericarditis but perhaps Myocarditis. Await results of Cardiac MRI prior to starting Ibuprofen for if this shows Myocarditis, would avoid NSAIDS and only use Colchicine and ASA.   2. Tobacco Use - reports smoking 1-1.5 ppd since Age 21. - cessation advised.    Lorri FrederickSigned, Brittany M Strader, PA-C 08/31/2016, 8:46 AM Pager: [phone]  Patient seen and independently examined with Norwood LevoBrittainy Strader, PA. We discussed all aspects of the encounter. I agree with the assessment and plan as stated above.  Patient with a history of significant tobacco abuse since age 21, strong family history of CAD (although not sure how reliable as he states that his father had his first MI at 3418 but does not think anything was done. He says that he had several cousins with MI's at 18-19yo and one had blockages).  For the past 3 days he has had fevers (durrently 100 degrees F associated with N/V but no abdominal pain or diarrhea.  He was watching a movie about 2 am and had sudden onset of severe chest pressure to the point he was crying.  This was associated with nausea and diaphoresis.  Pain much worse with inspiration and he could not lay down flat due to worsening of the pain.  In ER trop elevated at 2.73 with mildly elevated AST, d-dimer 0.7 and chest CT angio with no PE and no mention of coronary artery calcifications.  CRP elevated at 2.7 but sed rate is normal.  EKG  showed no acute ST changes.  Exam shows lungs CTA bilaterally, heart is RRR with no M/R/G, LE with no edema and 2+ pedal pulses bilaterally.  2D echo showed low normal LVF with no RWMAs and mildly dilated RV.  With no focal wall motion abnormality and a trop of 2.7 in association with fevers, N/V and pleuritic CP that is clearly worse in the supine position, suspect that this is viral myopericarditis.  His CRP is elevated although is sed rate is normal which does not fit with the rest of the picture.  He has no EKG changes of ischemia.  Will start on Colchicine 0.6mg  BID and get cardiac MRI to look for gad enhancement.  If MRI negative and continues to have CP with trop trending up then will need cath.  If MRI c/w acute pericarditis with myocardial involvement then will avoid NSAIDs and treat with high dose ASA and colchicine.  Otherwise if no evidence of myocardial involvement, will start NSAIDs.   Signed: Armanda Magic, MD [facility] 08/31/2016

## 2016-08-31 NOTE — Progress Notes (Signed)
Md on call paged that patient has left the hospital without signing AMA papers. Ilean SkillVeronica Zacherie Honeyman LPN

## 2016-08-31 NOTE — Progress Notes (Signed)
Pt is currently have discomfort of chest PA rounded and done a EKG Vitals stable, Treatment in place. Patient has chewing tobacco at bedside gave teaching.

## 2016-08-31 NOTE — Care Management Note (Signed)
Case Management Note  Patient Details  Name: Evan Conner MRN: [id number] Date of Birth: 01/13/1996  Subjective/Objective:                  From home alone. 21 y.o. male with past medical history of ADHD, prior opiate addiction, and no prior cardiac history who [facility] for evaluation of chest pain.    Action/Plan: Admit status OBSERVATION (CHEST PAIN); anticipate discharge HOME WITH SELF CARE.   Expected Discharge Date:   Estill Batten([place])               Expected Discharge Plan:  Home/Self Care  In-House Referral:  NA  Discharge planning Services  CM Consult  Status of Service:  In process, will continue to follow  If discussed at Long Length of Stay Meetings, dates discussed:    Additional Comments:  Oletta CohnWood, Wilfredo Canterbury, RN 08/31/2016, 8:48 AM

## 2016-08-31 NOTE — Progress Notes (Signed)
   I was paged by the nurse for pt c/o chest pain and pt wanting more informaiotn on his diagnosis and test results. The patient was having chest pain similar to his presenting symptoms. I went to see him and explained the diagnosis of myopericarditis and the use of aspirin and colchicine for antiinflammatory effects as well as avoiding other NSAIDS like Ibuprofen. An EKG done at the time was unchanged from previous. He was going to try some Tylenol and position changes to relieve pain.   Shortly after I left his room I was paged by the nurse stating that the patient was smoking in his room which she advised him was not allowed.   A short time later I was paged by the nurse to report that the patient has left. He did not sign out AMA, he just left. I have notified Dr. Tenny Crawoss who is the MD on call.   Berton BonJanine Vasco Chong, AGNP-C [facility] 08/31/2016  8:19 PM

## 2016-08-31 NOTE — ED Notes (Signed)
Echo being completed

## 2016-08-31 NOTE — Progress Notes (Signed)
Cardiac MRI showed small area of late gadolinium enhancement in the basal septum and no pericardial effusion c/w acute myopericarditis.  Will continue Colchicine and start ASA 650mg  q6hours.  Need to avoid NSAIDs in myocarditis.

## 2016-09-01 LAB — HIV ANTIBODY (ROUTINE TESTING W REFLEX): HIV SCREEN 4TH GENERATION: NONREACTIVE

## 2016-09-02 ENCOUNTER — Encounter: Payer: Self-pay | Admitting: Emergency Medicine

## 2016-09-02 ENCOUNTER — Other Ambulatory Visit: Payer: Self-pay

## 2016-09-02 ENCOUNTER — Emergency Department
Admission: EM | Admit: 2016-09-02 | Discharge: 2016-09-02 | Disposition: A | Discharge status: Home / Self Care | Payer: 59 | Attending: Emergency Medicine | Admitting: Emergency Medicine

## 2016-09-02 ENCOUNTER — Emergency Department: Payer: 59

## 2016-09-02 DIAGNOSIS — I409 Acute myocarditis, unspecified: Secondary | ICD-10-CM | POA: Insufficient documentation

## 2016-09-02 DIAGNOSIS — R0602 Shortness of breath: Secondary | ICD-10-CM | POA: Insufficient documentation

## 2016-09-02 DIAGNOSIS — I10 Essential (primary) hypertension: Secondary | ICD-10-CM | POA: Insufficient documentation

## 2016-09-02 DIAGNOSIS — R079 Chest pain, unspecified: Secondary | ICD-10-CM | POA: Diagnosis present

## 2016-09-02 DIAGNOSIS — I514 Myocarditis, unspecified: Secondary | ICD-10-CM

## 2016-09-02 DIAGNOSIS — F17210 Nicotine dependence, cigarettes, uncomplicated: Secondary | ICD-10-CM | POA: Insufficient documentation

## 2016-09-02 HISTORY — DX: Essential (primary) hypertension: I10

## 2016-09-02 LAB — BASIC METABOLIC PANEL
ANION GAP: 7 (ref 5–15)
BUN: 16 mg/dL (ref 6–20)
CALCIUM: 8.8 mg/dL — AB (ref 8.9–10.3)
CO2: 25 mmol/L (ref 22–32)
Chloride: 108 mmol/L (ref 101–111)
Creatinine, Ser: 0.69 mg/dL (ref 0.61–1.24)
GFR calc non Af Amer: >60 mL/min (ref >60)
Glucose, Bld: 97 mg/dL (ref 65–99)
Potassium: 3.9 mmol/L (ref 3.5–5.1)
Sodium: 140 mmol/L (ref 135–145)

## 2016-09-02 LAB — CBC
HCT: 42.1 % (ref 40.0–52.0)
HEMOGLOBIN: 14.6 g/dL (ref 13.0–18.0)
MCH: 29.6 pg (ref 26.0–34.0)
MCHC: 34.7 g/dL (ref 32.0–36.0)
MCV: 85.1 fL (ref 80.0–100.0)
Platelets: 262 10*3/uL (ref 150–440)
RBC: 4.95 MIL/uL (ref 4.40–5.90)
RDW: 12.5 % (ref 11.5–14.5)
WBC: 5.6 10*3/uL (ref 3.8–10.6)

## 2016-09-02 LAB — TROPONIN I: TROPONIN I: 0.65 ng/mL — AB (ref <0.03)

## 2016-09-02 MED ORDER — COLCHICINE 0.6 MG PO TABS: 0.6000 mg | ORAL_TABLET | Freq: Two times a day (BID) | ORAL | 0 refills | Status: DC

## 2016-09-02 NOTE — ED Notes (Signed)
Dr. Goodman at bedside.  

## 2016-09-02 NOTE — ED Notes (Signed)
Pt states central CP x 5 days intermittent. States feels like pressure. Was dx with myocarditis at [facility] and left AMA. Pt states N&V, twice today, denies blood in it. States temperature at home 2 days ago 102 and 100 yesterday. Has been taking 650mg  tylenol and ASA at home "because that's what they told me there." Has also been taking a family members nitro SL which he states helps. States he took 2 before coming to ED. States at [facility] had a bunch of tests done but states "they couldn't tell me why I had this, just that I had it so I left"

## 2016-09-02 NOTE — ED Provider Notes (Signed)
[facility] Emergency Department Provider Note    ____________________________________________   I have reviewed the triage vital signs and the nursing notes.   HISTORY  Chief Complaint Chest Pain and Shortness of Breath   History limited by: Not Limited   HPI Evan Conner is a 21 y.o. male who presents to the emergency department today because of concern for continued chest pain. Located in the center and left chest. The patient feels it is pressure like. It is associated with shortness of breath. Patient [facility] 2 days ago. He had been admitted for myocarditis. Left without prescription for colchicine. Here for that prescription.   Past Medical History:  Diagnosis Date  . ADHD (attention deficit hyperactivity disorder)   . Hypertension     Patient Active Problem List   Diagnosis Date Noted  . Chest pain 08/31/2016  . Elevated troponin 08/31/2016  . Fever   . Acute viral pericarditis     Past Surgical History:  Procedure Laterality Date  . TONSILLECTOMY      Prior to Admission medications   Medication Sig Start Date End Date Taking? Authorizing Provider  ibuprofen (ADVIL,MOTRIN) 800 MG tablet Take 1 tablet (800 mg total) by mouth every 8 (eight) hours as needed for moderate pain. 03/10/16   Mathews RobinsonsMitchell, Jessica B, PA-C  ondansetron (ZOFRAN ODT) 4 MG disintegrating tablet Take 1 tablet (4 mg total) by mouth every 8 (eight) hours as needed for nausea or vomiting. 02/23/16   Tommi RumpsSummers, Rhonda L, PA-C    Allergies Coconut oil  Family History  Problem Relation Age of Onset  . CAD Father        1st MI at age 22  . CAD Cousin     Social History Social History  Substance Use Topics  . Smoking status: Current Every Day Smoker    Packs/day: 1.50  . Smokeless tobacco: Former NeurosurgeonUser  . Alcohol use Yes    Review of Systems Constitutional: No fever/chills Eyes: No visual changes. ENT: No sore throat. Cardiovascular: Positive for  chest pain. Respiratory: Positive for shortness of breath. Gastrointestinal: No abdominal pain.  No nausea, no vomiting.  No diarrhea.   Genitourinary: Negative for dysuria. Musculoskeletal: Negative for back pain. Skin: Negative for rash. Neurological: Negative for headaches, focal weakness or numbness.  ____________________________________________   PHYSICAL EXAM:  VITAL SIGNS: ED Triage Vitals  Enc Vitals Group     BP 09/02/16 1629 139/74     Pulse Rate 09/02/16 1629 89     Resp 09/02/16 1629 18     Temp 09/02/16 1629 98.2 F (36.8 C)     Temp Source 09/02/16 1629 Oral     SpO2 09/02/16 1629 97 %     Weight 09/02/16 1630 260 lb (117.9 kg)     Height 09/02/16 1630 6' (1.829 m)     Head Circumference --      Peak Flow --      Pain Score 09/02/16 1609 8   Constitutional: Alert and oriented. Well appearing and in no distress. Eyes: Conjunctivae are normal.  ENT   Head: Normocephalic and atraumatic.   Nose: No congestion/rhinnorhea.   Mouth/Throat: Mucous membranes are moist.   Neck: No stridor. Hematological/Lymphatic/Immunilogical: No cervical lymphadenopathy. Cardiovascular: Normal rate, regular rhythm.  No murmurs, rubs, or gallops.  Respiratory: Normal respiratory effort without tachypnea nor retractions. Breath sounds are clear and equal bilaterally. No wheezes/rales/rhonchi. Gastrointestinal: Soft and non tender. No rebound. No guarding.  Genitourinary: Deferred Musculoskeletal: Normal range  of motion in all extremities. No lower extremity edema. Neurologic:  Normal speech and language. No gross focal neurologic deficits are appreciated.  Skin:  Skin is warm, dry and intact. No rash noted. Psychiatric: Mood and affect are normal. Speech and behavior are normal. Patient exhibits appropriate insight and judgment.  ____________________________________________    LABS (pertinent positives/negatives)  Labs Reviewed  BASIC METABOLIC PANEL - Abnormal;  Notable for the following:       Result Value   Calcium 8.8 (*)    All other components within normal limits  TROPONIN I - Abnormal; Notable for the following:    Troponin I 0.65 (*)    All other components within normal limits  CBC     ____________________________________________   EKG  I, Phineas SemenGraydon Kajsa Butrum, attending physician, personally viewed and interpreted this EKG  EKG Time: 1607 Rate: 80 Rhythm: normal sinus rhythm Axis: normal Intervals: qtc 452 QRS: narrow ST changes: no st elevation Impression: normal ekg  I, Phineas SemenGraydon Brenda Cowher, attending physician, personally viewed and interpreted this EKG  EKG Time: 1705 Rate: 71 Rhythm: normal sinus rhythm Axis: normal Intervals: qtc 438 QRS: narrow ST changes: no st elevation Impression: normal ekg  ____________________________________________    RADIOLOGY  CXR IMPRESSION:  No evidence for acute cardiopulmonary abnormality.       ____________________________________________   PROCEDURES  Procedures  ____________________________________________   INITIAL IMPRESSION / ASSESSMENT AND PLAN / ED COURSE  Pertinent labs & imaging results that were available during my care of the patient were reviewed by me and considered in my medical decision making (see chart for details).  Patient presented to the emergency department today because of desire to obtain colchicine prescription given recent diagnosis of myocarditis. Patient's troponin was elevated here. Discussed with cardiologist on call at [facility] who thought it was an appropriate decrease from inpatient labs will give patient colchicine 0.6  twice a day per recommendations.Will have patient followup with cardiology.  ____________________________________________   FINAL CLINICAL IMPRESSION(S) / ED DIAGNOSES  Final diagnoses:  Myocarditis, unspecified chronicity, unspecified myocarditis type [facility](HCC)     Note: This dictation was prepared with Dragon  dictation. Any transcriptional errors that result from this process are unintentional     Phineas SemenGoodman, Bobak Oguinn, MD 09/02/16 1815

## 2016-09-02 NOTE — ED Triage Notes (Signed)
Pt states that he has been having chest pain x 5 days. Pt states that he was diagnosed with myocarditis. Pt states that he was admitted at [facility] but left AMA. Pt states that he was supposed to get medication for the inflammation around his heart but they did not give him the RX since he left AMA.

## 2016-09-02 NOTE — Discharge Instructions (Signed)
Please seek medical attention for any high fevers, chest pain, shortness of breath, change in behavior, persistent vomiting, bloody stool or any other new or concerning symptoms.  

## 2016-09-05 LAB — CULTURE, BLOOD (ROUTINE X 2)
CULTURE: NO GROWTH
CULTURE: NO GROWTH
SPECIAL REQUESTS: ADEQUATE
Special Requests: ADEQUATE

## 2016-11-14 ENCOUNTER — Encounter: Payer: Self-pay | Admitting: Physician Assistant

## 2016-11-26 NOTE — Progress Notes (Deleted)
Cardiology Office Note    Date:  11/26/2016   ID:  Evan Conner, DOB 02/06/1995, MRN [id number]  PCP:  Kari BaarsHawkins, Edward, MD  Cardiologist: Dr.Turner   No chief complaint on file.   History of Present Illness:  Evan Conner is a 21 y.o. male with history of hypertension, ADHD, was admitted to [facility] with acute myopericarditis in 08/2016 and signed out AMA.  Patient has a history of cardiac MRI showing small area of late gadolinium enhancement in the basal septum and no pericardial effusion consistent with acute mild pericarditis.  He was continued on colchicine and aspirin 08/31/2016.  Avoid nonsteroidals        Past Medical History:  Diagnosis Date  . ADHD (attention deficit hyperactivity disorder)   . Hypertension     Past Surgical History:  Procedure Laterality Date  . TONSILLECTOMY      Current Medications: No outpatient prescriptions have been marked as taking for the 11/27/16 encounter (Appointment) with Dyann KiefLenze, Michele M, PA-C.     Allergies:   Coconut oil   Social History   Social History  . Marital status: Single    Spouse name: N/A  . Number of children: N/A  . Years of education: N/A   Social History Main Topics  . Smoking status: Current Every Day Smoker    Packs/day: 1.50  . Smokeless tobacco: Former NeurosurgeonUser  . Alcohol use Yes  . Drug use: Yes    Types: Marijuana  . Sexual activity: Not on file   Other Topics Concern  . Not on file   Social History Narrative  . No narrative on file     Family History:  The patient's ***family history includes CAD in his cousin and father.   ROS:   Please see the history of present illness.    ROS All other systems reviewed and are negative.   PHYSICAL EXAM:   VS:  There were no vitals taken for this visit.  Physical Exam  GEN: Well nourished, well developed, in no acute distress  HEENT: normal  Neck: no JVD, carotid bruits, or masses Cardiac:RRR; no murmurs, rubs, or gallops    Respiratory:  clear to auscultation bilaterally, normal work of breathing GI: soft, nontender, nondistended, + BS Ext: without cyanosis, clubbing, or edema, Good distal pulses bilaterally MS: no deformity or atrophy  Skin: warm and dry, no rash Neuro:  Alert and Oriented x 3, Strength and sensation are intact Psych: euthymic mood, full affect  Wt Readings from Last 3 Encounters:  09/02/16 260 lb (117.9 kg)  08/31/16 261 lb 14.5 oz (118.8 kg)  02/23/16 255 lb (115.7 kg)      Studies/Labs Reviewed:   EKG:  EKG is*** ordered today.  The ekg ordered today demonstrates ***  Recent Labs: 08/31/2016: ALT 39 09/02/2016: BUN 16; Creatinine, Ser 0.69; Hemoglobin 14.6; Platelets 262; Potassium 3.9; Sodium 140   Lipid Panel No results found for: CHOL, TRIG, HDL, CHOLHDL, VLDL, LDLCALC, LDLDIRECT  Additional studies/ records that were reviewed today include:  ***    ASSESSMENT:    No diagnosis found.   PLAN:  In order of problems listed above:      Medication Adjustments/Labs and Tests Ordered: Current medicines are reviewed at length with the patient today.  Concerns regarding medicines are outlined above.  Medication changes, Labs and Tests ordered today are listed in the Patient Instructions below. There are no Patient Instructions on file for this visit.   Signed, Jacolyn ReedyMichele Lenze, PA-C  11/26/2016  3:08 PM    [facility] [address] [place], [place], [place]  [address] Phone: [phone]; Fax: [phone]

## 2016-11-27 ENCOUNTER — Ambulatory Visit: Payer: 59 | Admitting: Physician Assistant

## 2016-11-29 ENCOUNTER — Encounter: Payer: Self-pay | Admitting: Physician Assistant

## 2016-12-21 ENCOUNTER — Encounter (HOSPITAL_COMMUNITY): Payer: Self-pay | Admitting: Emergency Medicine

## 2016-12-21 ENCOUNTER — Other Ambulatory Visit: Payer: Self-pay

## 2016-12-21 ENCOUNTER — Emergency Department (HOSPITAL_COMMUNITY)
Admission: EM | Admit: 2016-12-21 | Discharge: 2016-12-21 | Disposition: A | Discharge status: Home / Self Care | Payer: 59 | Attending: Emergency Medicine | Admitting: Emergency Medicine

## 2016-12-21 DIAGNOSIS — Z79899 Other long term (current) drug therapy: Secondary | ICD-10-CM | POA: Insufficient documentation

## 2016-12-21 DIAGNOSIS — I10 Essential (primary) hypertension: Secondary | ICD-10-CM | POA: Diagnosis not present

## 2016-12-21 DIAGNOSIS — F909 Attention-deficit hyperactivity disorder, unspecified type: Secondary | ICD-10-CM | POA: Insufficient documentation

## 2016-12-21 DIAGNOSIS — F4321 Adjustment disorder with depressed mood: Secondary | ICD-10-CM | POA: Diagnosis present

## 2016-12-21 DIAGNOSIS — F17210 Nicotine dependence, cigarettes, uncomplicated: Secondary | ICD-10-CM | POA: Diagnosis not present

## 2016-12-21 DIAGNOSIS — F4329 Adjustment disorder with other symptoms: Secondary | ICD-10-CM | POA: Diagnosis present

## 2016-12-21 LAB — COMPREHENSIVE METABOLIC PANEL
ALBUMIN: 4.2 g/dL (ref 3.5–5.0)
ALK PHOS: 58 U/L (ref 38–126)
ALT: 38 U/L (ref 17–63)
ANION GAP: 8 (ref 5–15)
AST: 23 U/L (ref 15–41)
BILIRUBIN TOTAL: 0.9 mg/dL (ref 0.3–1.2)
BUN: 12 mg/dL (ref 6–20)
CALCIUM: 9.4 mg/dL (ref 8.9–10.3)
CO2: 24 mmol/L (ref 22–32)
Chloride: 104 mmol/L (ref 101–111)
Creatinine, Ser: 0.95 mg/dL (ref 0.61–1.24)
GFR calc Af Amer: >60 mL/min (ref >60)
GFR calc non Af Amer: >60 mL/min (ref >60)
GLUCOSE: 101 mg/dL — AB (ref 65–99)
Potassium: 3.8 mmol/L (ref 3.5–5.1)
SODIUM: 136 mmol/L (ref 135–145)
TOTAL PROTEIN: 7.8 g/dL (ref 6.5–8.1)

## 2016-12-21 LAB — RAPID URINE DRUG SCREEN, HOSP PERFORMED
Amphetamines: NOT DETECTED
BARBITURATES: NOT DETECTED
BENZODIAZEPINES: POSITIVE — AB
COCAINE: NOT DETECTED
Opiates: NOT DETECTED
TETRAHYDROCANNABINOL: POSITIVE — AB

## 2016-12-21 LAB — CBC
HEMATOCRIT: 48.8 % (ref 39.0–52.0)
HEMOGLOBIN: 16.6 g/dL (ref 13.0–17.0)
MCH: 29.9 pg (ref 26.0–34.0)
MCHC: 34 g/dL (ref 30.0–36.0)
MCV: 87.8 fL (ref 78.0–100.0)
Platelets: 269 10*3/uL (ref 150–400)
RBC: 5.56 MIL/uL (ref 4.22–5.81)
RDW: 12.3 % (ref 11.5–15.5)
WBC: 8.1 10*3/uL (ref 4.0–10.5)

## 2016-12-21 LAB — SALICYLATE LEVEL: Salicylate Lvl: <7 mg/dL (ref 2.8–30.0)

## 2016-12-21 LAB — ETHANOL: Alcohol, Ethyl (B): <10 mg/dL (ref <10)

## 2016-12-21 LAB — ACETAMINOPHEN LEVEL

## 2016-12-21 NOTE — ED Provider Notes (Signed)
[place] DEPT Provider Note   CSN: [id number] Arrival date & time: 12/21/16  3710     History   Chief Complaint Chief Complaint  Patient presents with  . Suicidal    HPI Evan Conner is a 21 y.o. male.  Patient is a 21 year old male with a history of polysubstance abuse, ADHD and hypertension who is presenting today from [facility].  Patient states he went to [facility] on Wednesday and had expressed the desire to leave today and they sent him here because they were not comfortable sending him home.  Patient states that he had met with a counselor on Wednesday and told them some things he had never told anybody else and had mentioned if he were to die he would not be sad about it but denies trying to actively seek a reason to hurt himself.  He states he does not want to commit suicide he would does not be sad if he died.  He is says he hates the way his life is right now and does not want to continue living this kind of life.  He denies a history of depression and takes no medications currently.  He did try to commit suicide with a gun when he was 12 but has not had any attempts since that time.  Patient does live with his parents.  He enjoys hunting and fishing and does have weapons in his home.  He thinks counseling may help but he wants to do that from home and does not know why he needs to be at [facility] for 28 days.   The history is provided by the patient.    Past Medical History:  Diagnosis Date  . ADHD (attention deficit hyperactivity disorder)   . Hypertension     Patient Active Problem List   Diagnosis Date Noted  . Chest pain 08/31/2016  . Elevated troponin 08/31/2016  . Fever   . Acute viral pericarditis     Past Surgical History:  Procedure Laterality Date  . TONSILLECTOMY         Home Medications    Prior to Admission medications   Medication Sig Start Date End Date Taking? Authorizing Provider    colchicine 0.6 MG tablet Take 1 tablet (0.6 mg total) by mouth 2 (two) times daily. 09/02/16 09/12/16  Nance Pear, MD  ibuprofen (ADVIL,MOTRIN) 800 MG tablet Take 1 tablet (800 mg total) by mouth every 8 (eight) hours as needed for moderate pain. 03/10/16   Avie Echevaria B, PA-C  ondansetron (ZOFRAN ODT) 4 MG disintegrating tablet Take 1 tablet (4 mg total) by mouth every 8 (eight) hours as needed for nausea or vomiting. 02/23/16   Johnn Hai, PA-C    Family History Family History  Problem Relation Age of Onset  . CAD Father        1st MI at age 24  . CAD Cousin     Social History Social History   Tobacco Use  . Smoking status: Current Every Day Smoker    Packs/day: 1.50  . Smokeless tobacco: Former Network engineer Use Topics  . Alcohol use: No    Frequency: Never  . Drug use: Yes    Types: Marijuana     Allergies   Coconut oil   Review of Systems Review of Systems  All other systems reviewed and are negative.    Physical Exam Updated Vital Signs BP 130/71 (BP Location: Left Arm)   Pulse 93  Temp 98.1 F (36.7 C) (Oral)   Resp 20   Ht 6' (1.829 m)   Wt 124.7 kg (275 lb)   SpO2 97%   BMI 37.30 kg/m   Physical Exam  Constitutional: He is oriented to person, place, and time. He appears well-developed and well-nourished. No distress.  HENT:  Head: Normocephalic and atraumatic.  Mouth/Throat: Oropharynx is clear and moist.  Eyes: Conjunctivae and EOM are normal. Pupils are equal, round, and reactive to light.  Neck: Normal range of motion. Neck supple.  Cardiovascular: Normal rate, regular rhythm and intact distal pulses.  No murmur heard. Pulmonary/Chest: Effort normal and breath sounds normal. No respiratory distress. He has no wheezes. He has no rales.  Abdominal: Soft. He exhibits no distension. There is no tenderness. There is no rebound and no guarding.  Musculoskeletal: Normal range of motion. He exhibits no edema or tenderness.   Neurological: He is alert and oriented to person, place, and time.  Skin: Skin is warm and dry. No rash noted. No erythema.  Psychiatric: He has a normal mood and affect. His speech is normal and behavior is normal. Judgment normal. Cognition and memory are normal. He does not exhibit a depressed mood. He expresses no homicidal and no suicidal ideation. He expresses no suicidal plans and no homicidal plans. Abnormal remote memory:   Nursing note and vitals reviewed.    ED Treatments / Results  Labs (all labs ordered are listed, but only abnormal results are displayed) Labs Reviewed  COMPREHENSIVE METABOLIC PANEL - Abnormal; Notable for the following components:      Result Value   Glucose, Bld 101 (*)    All other components within normal limits  RAPID URINE DRUG SCREEN, HOSP PERFORMED - Abnormal; Notable for the following components:   Benzodiazepines POSITIVE (*)    Tetrahydrocannabinol POSITIVE (*)    All other components within normal limits  CBC  ETHANOL  SALICYLATE LEVEL  ACETAMINOPHEN LEVEL    EKG  EKG Interpretation None       Radiology No results found.  Procedures Procedures (including critical care time)  Medications Ordered in ED Medications - No data to display   Initial Impression / Assessment and Plan / ED Course  I have reviewed the triage vital signs and the nursing notes.  Pertinent labs & imaging results that were available during my care of the patient were reviewed by me and considered in my medical decision making (see chart for details).     Patient is a healthy 21 year old male with polysubstance abuse presenting today from [facility] with evaluation for concern for suicidal thoughts.  Patient adamantly denies being suicidal and states he does not seek to hurt himself he just says that right now his life is terrible and he does not want to continue living the same way he has been living.  He does not want to hurt himself.  However he  states if he died he would not be sad about it.  Patient has had suicide attempt in the past when he was 35 but none since.  Patient is not currently on any medication and is calm and cooperative.  Will have TTS evaluate.  Patient is currently medically clear.  Final Clinical Impressions(s) / ED Diagnoses   Final diagnoses:  Adjustment disorder with disturbance of emotion    ED Discharge Orders    None       Blanchie Dessert, MD 12/21/16 1331
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
## 2016-12-21 NOTE — BH Assessment (Signed)
[facility] Assessment Progress Note  Per Nanine MeansJamison Lord, DNP, this pt does not require psychiatric hospitalization at this time.  [facility], or with the [facility].  These referrals have been included in pt's discharge instructions.  Pt's nurse, Aram BeechamCynthia, has been notified.  Doylene Canninghomas Shalunda Lindh, MA Triage Specialist [phone]

## 2016-12-21 NOTE — [facility] Suicide Risk Assessment (Signed)
Suicide Risk Assessment  Discharge Assessment   [facility]   Principal Problem: Adjustment disorder with disturbance of emotion Discharge Diagnoses:  Patient Active Problem List   Diagnosis Date Noted  . Adjustment disorder with disturbance of emotion [F43.29] 12/21/2016    Priority: High  . Chest pain [R07.9] 08/31/2016  . Elevated troponin [R74.8] 08/31/2016  . Fever [R50.9]   . Acute viral pericarditis [I30.1]     Total Time spent with patient: 45 minutes  Musculoskeletal: Strength & Muscle Tone: within normal limits Gait & Station: normal Patient leans: N/A  Psychiatric Specialty Exam:   Blood pressure 130/71, pulse 93, temperature 98.1 F (36.7 C), temperature source Oral, resp. rate 20, height 6' (1.829 m), weight 124.7 kg (275 lb), SpO2 97 %.Body mass index is 37.3 kg/m.  General Appearance: Casual  Eye Contact::  Good  Speech:  Normal Rate409  Volume:  Normal  Mood:  Euthymic  Affect:  Congruent  Thought Process:  Coherent and Descriptions of Associations: Intact  Orientation:  Full (Time, Place, and Person)  Thought Content:  WDL and Logical  Suicidal Thoughts:  No  Homicidal Thoughts:  No  Memory:  Immediate;   Good Recent;   Good Remote;   Good  Judgement:  Fair  Insight:  Fair  Psychomotor Activity:  Normal  Concentration:  Good  Recall:  Good  Fund of Knowledge:Good  Language: Good  Akathisia:  No  Handed:  Right  AIMS (if indicated):     Assets:  Housing Leisure Time Physical Health Resilience Social Support  Sleep:     Cognition: WNL  ADL's:  Intact   Mental Status Per Nursing Assessment::   On Admission:   21 yo male who was at [facility] but reports not using in 3 weeks.  He made an off-handed comment about a suicide attempt when he was 12 about too bad it did not work.  Evan KeenCory regrets saying this and denies any other attempts or current suicidal ideations.  Inpatient hospitalization offered  if he felt he needed it, he declined.  He feels he came off the "pills" himself and does not need inpatient.  No suicidal/homicidal ideations, hallucinations, or withdrawal symptoms.  He lives with his parents, there are guns in the house but secured under lock and key by his dad, RN will confirm when he is picked up by his family.  Stable for discharge.  Demographic Factors:  Male, Adolescent or young adult and Caucasian  Loss Factors: NA  Historical Factors: NA  Risk Reduction Factors:   Sense of responsibility to family, Living with another person, especially a relative and Positive social support  Continued Clinical Symptoms:  None   Cognitive Features That Contribute To Risk:  None    Suicide Risk:  Minimal: No identifiable suicidal ideation.  Patients presenting with no risk factors but with morbid ruminations; may be classified as minimal risk based on the severity of the depressive symptoms    Plan Of Care/Follow-up recommendations:  Activity:  as tolerated  Diet:  heart healthy diet  Evan Benning, NP 12/21/2016, 1:21 PM

## 2016-12-21 NOTE — Discharge Instructions (Signed)
For your behavioral health needs, you are advised to follow up with one of the following providers.  Both offer psychiatry, counseling, and Chemical Dependency Intensive Outpatient Programs.  Contact them at your earliest opportunity to ask about scheduling an intake appointment:       [facility]      [address]      [place], [place] [address]      [phone]       [facility]      [address]      [place], [place] [address]      [phone]

## 2017-02-08 ENCOUNTER — Emergency Department (HOSPITAL_COMMUNITY)
Admission: EM | Admit: 2017-02-08 | Discharge: 2017-02-08 | Discharge status: Against Medical Advice | Payer: 59 | Attending: Emergency Medicine | Admitting: Emergency Medicine

## 2017-02-08 ENCOUNTER — Emergency Department (HOSPITAL_COMMUNITY): Payer: 59

## 2017-02-08 ENCOUNTER — Encounter (HOSPITAL_COMMUNITY): Payer: Self-pay

## 2017-02-08 DIAGNOSIS — I10 Essential (primary) hypertension: Secondary | ICD-10-CM | POA: Insufficient documentation

## 2017-02-08 DIAGNOSIS — R079 Chest pain, unspecified: Secondary | ICD-10-CM

## 2017-02-08 DIAGNOSIS — F909 Attention-deficit hyperactivity disorder, unspecified type: Secondary | ICD-10-CM | POA: Diagnosis not present

## 2017-02-08 DIAGNOSIS — F17210 Nicotine dependence, cigarettes, uncomplicated: Secondary | ICD-10-CM | POA: Diagnosis not present

## 2017-02-08 DIAGNOSIS — Z79899 Other long term (current) drug therapy: Secondary | ICD-10-CM | POA: Diagnosis not present

## 2017-02-08 LAB — CBC WITH DIFFERENTIAL/PLATELET
BASOS ABS: 0 10*3/uL (ref 0.0–0.1)
BASOS PCT: 1 %
EOS ABS: 0.2 10*3/uL (ref 0.0–0.7)
EOS PCT: 3 %
HCT: 44.3 % (ref 39.0–52.0)
HEMOGLOBIN: 14.9 g/dL (ref 13.0–17.0)
Lymphocytes Relative: 31 %
Lymphs Abs: 2.4 10*3/uL (ref 0.7–4.0)
MCH: 29.7 pg (ref 26.0–34.0)
MCHC: 33.6 g/dL (ref 30.0–36.0)
MCV: 88.2 fL (ref 78.0–100.0)
Monocytes Absolute: 0.5 10*3/uL (ref 0.1–1.0)
Monocytes Relative: 6 %
NEUTROS PCT: 59 %
Neutro Abs: 4.7 10*3/uL (ref 1.7–7.7)
PLATELETS: 260 10*3/uL (ref 150–400)
RBC: 5.02 MIL/uL (ref 4.22–5.81)
RDW: 12.2 % (ref 11.5–15.5)
WBC: 7.9 10*3/uL (ref 4.0–10.5)

## 2017-02-08 LAB — COMPREHENSIVE METABOLIC PANEL
ALBUMIN: 3.9 g/dL (ref 3.5–5.0)
ALK PHOS: 48 U/L (ref 38–126)
ALT: 25 U/L (ref 17–63)
AST: 21 U/L (ref 15–41)
Anion gap: 10 (ref 5–15)
BUN: 6 mg/dL (ref 6–20)
CHLORIDE: 101 mmol/L (ref 101–111)
CO2: 28 mmol/L (ref 22–32)
CREATININE: 1.01 mg/dL (ref 0.61–1.24)
Calcium: 9.2 mg/dL (ref 8.9–10.3)
GFR calc non Af Amer: >60 mL/min (ref >60)
GLUCOSE: 101 mg/dL — AB (ref 65–99)
Potassium: 3.6 mmol/L (ref 3.5–5.1)
SODIUM: 139 mmol/L (ref 135–145)
Total Bilirubin: 0.9 mg/dL (ref 0.3–1.2)
Total Protein: 6.7 g/dL (ref 6.5–8.1)

## 2017-02-08 LAB — TROPONIN I: Troponin I: <0.03 ng/mL (ref <0.03)

## 2017-02-08 LAB — SEDIMENTATION RATE: SED RATE: 2 mm/h (ref 0–16)

## 2017-02-08 NOTE — ED Triage Notes (Signed)
Pt arrived by POV, nurse first pulled out of car and brought to Trauma A, c/o chest pain. Dr. Hyacinth MeekerMiller at bedside.

## 2017-02-08 NOTE — ED Provider Notes (Signed)
[facility] EMERGENCY DEPARTMENT Provider Note   CSN: [id number] Arrival date & time: 02/08/17  1229     History   Chief Complaint Chief Complaint  Patient presents with  . Chest Pain    HPI Deano Tomaszewski is a 22 y.o. male.  HPI  The patient is a 22 year old male who had been admitted to the hospital on August 2 of 2018 with a diagnosis of myopericarditis after the patient had developed chest pain which had become quite severe.  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  He ended up leaving AGAINST MEDICAL ADVICE from the hospital and left without his medications, he had a cardiac MRI which showed a findings consistent with acute myopericarditis and was encouraged to take colchicine however he left the hospital without this medication.  He then followed up a couple of days later at an outside facility requesting that prescription as he was still having pain.  His troponin had appropriately decreased.  The patient was then seen on November 23 for more of a psychiatric complaint, adjustment disorder, some depression associated with his substance abuse.  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  The patient now states today on arrival that he has had chest pain for approximately 2 weeks, it is mid chest and left chest, feels like a heaviness like an elephant sitting on his chest, no radiation to the arms the shoulders or the neck, no swelling of the legs.  He works at an Teacher, English as a foreign language, he works with his hands, he reports having increased chest pain today which ultimately culminated in a syncopal event.  He has not followed up with cardiology, at this time the patient is not giving much in the way of information, he is grabbing his chest moaning and is mildly somnolent  Past  Medical History:  Diagnosis Date  . ADHD (attention deficit hyperactivity disorder)   . Hypertension     Patient Active Problem List   Diagnosis Date Noted  . Adjustment disorder with disturbance of emotion 12/21/2016  . Chest pain 08/31/2016  . Elevated troponin 08/31/2016  . Fever   . Acute viral pericarditis     Past Surgical History:  Procedure Laterality Date  . TONSILLECTOMY         Home Medications    Prior to Admission medications   Medication Sig Start Date End Date Taking? Authorizing Provider  alum & mag hydroxide-simeth (MAALOX/MYLANTA) 200-200-20 MG/5ML suspension Take 10-20 mLs by mouth as needed for indigestion or heartburn.    [provider]  benzocaine-menthol (CHLORAEPTIC) 6-10 MG lozenge Take 1 lozenge by mouth every 4 (four) hours as needed for sore throat.    [provider]  benzonatate (TESSALON) 100 MG capsule Take 200 mg by mouth every 8 (eight) hours as needed for cough.    [provider]  colchicine 0.6 MG tablet Take 1 tablet (0.6 mg total) by mouth 2 (two) times daily. Patient not taking: Reported on 12/21/2016 09/02/16 09/12/16  Phineas Semen, MD  dicyclomine (BENTYL) 20 MG tablet Take 20 mg by mouth 4 (four) times daily. Started 11/21 for 10 days    [provider]  Fructose-Dextrose-Phosphor Acd (NAUSEA CONTROL) 1.87-1.87-21.5 SOLN Take 5-10 mLs by mouth every 15 (fifteen) minutes as needed (for nausea).  [provider]  guaiFENesin (MUCINEX) 600 MG 12 hr tablet Take 600 mg by mouth 2 (two) times daily as needed (for congestion).    [provider]  loperamide (IMODIUM A-D) 2 MG tablet Take 2-4 mg by mouth 4 (four) times daily as needed for diarrhea or loose stools.    [provider]  methocarbamol (ROBAXIN) 500 MG tablet Take 1,000 mg by mouth 4 (four) times daily. Started 11/21 for 10 days    [provider]  Multiple Vitamin-Folic Acid TABS Take 1 tablet by mouth  daily.    [provider]    Family History Family History  Problem Relation Age of Onset  . CAD Father        1st MI at age 60  . CAD Cousin     Social History Social History   Tobacco Use  . Smoking status: Current Every Day Smoker    Packs/day: 1.50  . Smokeless tobacco: Former Engineer, water Use Topics  . Alcohol use: No    Frequency: Never  . Drug use: Yes    Types: Marijuana     Allergies   Coconut oil   Review of Systems Review of Systems  All other systems reviewed and are negative.    Physical Exam Updated Vital Signs BP 127/67   Pulse (!) 55   Resp 11   Ht 6' (1.829 m)   Wt 120.2 kg (265 lb)   SpO2 97%   BMI 35.94 kg/m   Physical Exam  Constitutional: He appears well-developed and well-nourished. He appears distressed.  HENT:  Head: Normocephalic and atraumatic.  Mouth/Throat: Oropharynx is clear and moist. No oropharyngeal exudate.  Eyes: Conjunctivae and EOM are normal. Pupils are equal, round, and reactive to light. Right eye exhibits no discharge. Left eye exhibits no discharge. No scleral icterus.  Neck: Normal range of motion. Neck supple. No JVD present. No thyromegaly present.  Cardiovascular: Normal rate, regular rhythm, normal heart sounds and intact distal pulses. Exam reveals no gallop and no friction rub.  No murmur heard. Pulmonary/Chest: Effort normal and breath sounds normal. No respiratory distress. He has no wheezes. He has no rales.  Abdominal: Soft. Bowel sounds are normal. He exhibits no distension and no mass. There is no tenderness.  Musculoskeletal: Normal range of motion. He exhibits no edema or tenderness.  Lymphadenopathy:    He has no cervical adenopathy.  Neurological: Coordination normal.  The patient is easily arousable follows commands, is able to sit up stand however he grabs his chest during all of these motions.  Skin: Skin is warm and dry. No rash noted. No erythema.  Psychiatric: He has a normal  mood and affect. His behavior is normal.  Nursing note and vitals reviewed.    ED Treatments / Results  Labs (all labs ordered are listed, but only abnormal results are displayed) Labs Reviewed  COMPREHENSIVE METABOLIC PANEL - Abnormal; Notable for the following components:      Result Value   Glucose, Bld 101 (*)    All other components within normal limits  CBC WITH DIFFERENTIAL/PLATELET  TROPONIN I  SEDIMENTATION RATE    EKG  EKG Interpretation  Date/Time:  Friday February 08 2017 12:35:23 EST Ventricular Rate:  64 PR Interval:    QRS Duration: 114 QT Interval:  383 QTC Calculation: 396 R Axis:   93 Text Interpretation:  Sinus rhythm Atrial premature complex Borderline intraventricular conduction delay since last tracing no significant change Confirmed by Eber Hong (  [id number]) on 02/08/2017 12:42:03 PM       Radiology Dg Chest Port 1 View  Result Date: 02/08/2017 CLINICAL DATA:  Chest pain with radiation to the left. EXAM: PORTABLE CHEST 1 VIEW COMPARISON:  09/02/2016. FINDINGS: Mediastinum hilar structures are normal. Borderline cardiomegaly. No pulmonary venous congestion. Questionable left upper lobe rounded density, most likely prominent left anterior first rib costochondral calcification. PA lateral chest x-ray suggested for further evaluation. No focal infiltrate otherwise noted. No pleural effusion or pneumothorax. IMPRESSION: Questionable density left upper lobe, most likely prominent left anterior first rib costochondral calcification. PA and lateral chest x-ray suggested for further evaluation to exclude a focal infiltrate or mass lesion. No acute pulmonary disease otherwise noted. Electronically Signed   By: Maisie Fushomas  Register   On: 02/08/2017 13:37    Procedures Procedures (including critical care time)  Medications Ordered in ED Medications - No data to display   Initial Impression / Assessment and Plan / ED Course  I have reviewed the triage vital signs and  the nursing notes.  Pertinent labs & imaging results that were available during my care of the patient were reviewed by me and considered in my medical decision making (see chart for details).    I question the etiology of the patient's pain, he has a known history of cardiac dysfunction related to his prior Admission.  Echocardiograph at that time showed an ejection fraction of 50-55% with normal systolic function.  There was normal diastolic function at that time as well.  The patient has had all of his labs returned normal including sed rate troponin blood counts and conference of metabolic panel.  His EKG reveals no signs of pathologic findings however given the patient's significant history over the last several months I recommended that he be further evaluated by cardiology.  He has refused further evaluation and is taking his cardiac leads off, he is trying to take out his IV and refuses to stay.  He expresses his understanding of what he is doing and also endorses that he will return should his symptoms worsen.  The patient is leaving AGAINST MEDICAL ADVICE  His significant other is here to drive him home  Final Clinical Impressions(s) / ED Diagnoses   Final diagnoses:  Left sided chest pain    ED Discharge Orders    None       Eber HongMiller, Bethan Adamek, MD 02/08/17 1449

## 2017-02-11 ENCOUNTER — Ambulatory Visit: Payer: 59 | Admitting: Cardiology

## 2017-02-11 ENCOUNTER — Encounter: Payer: Self-pay | Admitting: Cardiology

## 2017-02-11 ENCOUNTER — Ambulatory Visit (INDEPENDENT_AMBULATORY_CARE_PROVIDER_SITE_OTHER): Payer: 59 | Admitting: Cardiology

## 2017-02-11 VITALS — BP 100/60 | HR 67 | Ht 72.0 in | Wt 272.0 lb

## 2017-02-11 DIAGNOSIS — I514 Myocarditis, unspecified: Secondary | ICD-10-CM

## 2017-02-11 DIAGNOSIS — R079 Chest pain, unspecified: Secondary | ICD-10-CM

## 2017-02-11 DIAGNOSIS — R55 Syncope and collapse: Secondary | ICD-10-CM

## 2017-02-11 MED ORDER — COLCHICINE 0.6 MG PO TABS: 0.6000 mg | ORAL_TABLET | Freq: Two times a day (BID) | ORAL | 0 refills | Status: DC

## 2017-02-11 NOTE — Progress Notes (Signed)
Cardiology Office Note   Date:  02/11/2017   ID:  Evan Conner, DOB 10-20-1995, MRN [id number]  PCP:  Sinda Du, MD  Cardiologist:   No primary care provider on file.   Chief Complaint  Patient presents with  . Chest Pain      History of Present Illness: Evan Conner is a 22 y.o. male who presents for evaluation of chest pain.   He was admitted to the hospital in August with chest discomfort.  CTA at that time demonstrated no evidence of pulmonary embolism.  His initial troponin however was elevated.  Echocardiogram was unremarkable.  There was a small area of hyperenhancement on MRI with gadolinium.  There is some mild LVH.  He was treated with colchicine and aspirin.  However, he left AGAINST MEDICAL ADVICE and did come back and get medications eventually.  He has been managed for active substance abuse.  He was in the ED last week with chest pain.  I reviewed these records for this visit.  He had a normal sedimentation rate and blood troponin.  He left the emergency room AGAINST MEDICAL ADVICE.  He says that on the day he presented to the emergency room he was at work sitting some bolts on a tire when he passed out.  His girlfriend came to take him home she said he was awake when she got there.  There was no trauma.  The events surrounding this are not entirely clear.  He did say his chest hurt.  He apparently passed out again when he was in the car.  He not done this before and has not done it since.  He thinks the chest discomfort was similar to August but it was somewhat different than previous heartburn in August and this might have felt more like his heartburn.  He otherwise has not had any chest discomfort since then he started taking aspirin and colchicine again.  He has never completed this previously.  He has not had any cough fevers or chills.  He is able to be active without bringing on this discomfort.  He has not noted any palpitations.  He has had no new shortness  of breath, PND or orthopnea.   Past Medical History:  Diagnosis Date  . ADHD (attention deficit hyperactivity disorder)   . Hypertension     Past Surgical History:  Procedure Laterality Date  . TONSILLECTOMY       Current Outpatient Medications  Medication Sig Dispense Refill  . alum & mag hydroxide-simeth (MAALOX/MYLANTA) 200-200-20 MG/5ML suspension Take 10-20 mLs by mouth as needed for indigestion or heartburn.    Marland Kitchen aspirin 325 MG tablet Take 325 mg by mouth daily.    . colchicine 0.6 MG tablet Take 1 tablet (0.6 mg total) by mouth 2 (two) times daily. 60 tablet 0   No current facility-administered medications for this visit.     Allergies:   Coconut oil     ROS:  Please see the history of present illness.   Otherwise, review of systems are positive for none.   All other systems are reviewed and negative.    PHYSICAL EXAM: VS:  BP 100/60   Pulse 67   Ht 6' (1.829 m)   Wt 272 lb (123.4 kg)   SpO2 96%   BMI 36.89 kg/m  , BMI Body mass index is 36.89 kg/m. GENERAL:  Well appearing HEENT:  Pupils equal round and reactive, fundi not visualized, oral mucosa unremarkable NECK:  No jugular venous  distention, waveform within normal limits, carotid upstroke brisk and symmetric, no bruits, no thyromegaly LYMPHATICS:  No cervical, inguinal adenopathy LUNGS:  Clear to auscultation bilaterally BACK:  No CVA tenderness CHEST:  Unremarkable HEART:  PMI not displaced or sustained,S1 and S2 within normal limits, no S3, no S4, no clicks, no rubs, no murmurs ABD:  Flat, positive bowel sounds normal in frequency in pitch, no bruits, no rebound, no guarding, no midline pulsatile mass, no hepatomegaly, no splenomegaly EXT:  2 plus pulses throughout, no edema, no cyanosis no clubbing SKIN:  No rashes no nodules NEURO:  Cranial nerves II through XII grossly intact, motor grossly intact throughout PSYCH:  Cognitively intact, oriented to person place and time    EKG:  EKG is not  ordered today. The ekg ordered 02/09/16 demonstrates sinus rhythm, rate 64, axis within normal limits, intervals within normal limits, no acute ST-T wave changes.   Recent Labs:  02/08/2017: ALT 25; BUN 6; Creatinine, Ser 1.01; Hemoglobin 14.9; Platelets 260; Potassium 3.6; Sodium 139    Lipid Panel No results found for: CHOL, TRIG, HDL, CHOLHDL, VLDL, LDLCALC, LDLDIRECT    Wt Readings from Last 3 Encounters:  02/11/17 272 lb (123.4 kg)  02/08/17 265 lb (120.2 kg)  12/21/16 275 lb (124.7 kg)      Other studies Reviewed: Additional studies/ records that were reviewed today include: ED records. Review of the above records demonstrates:  Please see elsewhere in the note.     ASSESSMENT AND PLAN:   MYOCARDITIS: The patient had no objective evidence of ongoing myocarditis when  he was in the emergency room.  I would like him to complete a course of aspirin colchicine however.  My plan would be that if he had this discomfort again in the future I would want him to again get troponins, C-reactive protein and ESR.  If these are normal then it would be very unlikely to be myocarditis and I would not suspect that he would need another MRI.  At that point I would consider a GI etiology as he also has heartburn that is difficult to sort out from his episode of myocarditis.  SYNCOPE: The etiology is not clear.  He has not had any further episodes.  He will let me know if he does because he would probably need to monitor.  Current medicines are reviewed at length with the patient today.  The patient does not have concerns regarding medicines.  The following changes have been made:  no change  Labs/ tests ordered today include: None No orders of the defined types were placed in this encounter.    Disposition:   FU with as needed.      Signed, Minus Breeding, MD  02/11/2017 12:58 PM    [facility]

## 2017-02-11 NOTE — Patient Instructions (Signed)
Medication Instructions:  START- Aspirin 325 mg daily  If you need a refill on your cardiac medications before your next appointment, please call your pharmacy.  Labwork: None Ordered   Testing/Procedures: None Ordered  Follow-Up: Your physician wants you to follow-up in: As Needed.    Thank you for choosing [facility]!!

## 2017-02-22 ENCOUNTER — Telehealth: Payer: Self-pay | Admitting: Cardiology

## 2017-02-22 MED ORDER — COLCHICINE 0.6 MG PO TABS: 0.6000 mg | ORAL_TABLET | Freq: Two times a day (BID) | ORAL | 0 refills | Status: DC

## 2017-02-22 NOTE — Telephone Encounter (Signed)
Pt returned call stated that he had not pick up rx that was send in on 02/11/2017 and it was put back on the shelf after 10 days and want to know if we can resend it, advised pt I will resend it but he will have to get next refills from PCP.

## 2017-02-22 NOTE — Telephone Encounter (Signed)
Rx has been sent to the pharmacy electronically. ° °

## 2017-02-22 NOTE — Telephone Encounter (Signed)
New message     Patient states he never picked up last refill. Pharmacy request resend per patient  *STAT* If patient is at the pharmacy, call can be transferred to refill team.   1. Which medications need to be refilled? (please list name of each medication and dose if known) colchicine 0.6 MG tablet  2. Which pharmacy/location (including street and city if local pharmacy) is medication to be sent to? [id number] - [place], [place] - [address]  3. Do they need a 30 day or 90 day supply? 90

## 2017-02-22 NOTE — Telephone Encounter (Signed)
Called and leave message on pt voicemail to give office a call

## 2017-03-27 ENCOUNTER — Other Ambulatory Visit: Payer: Self-pay | Admitting: *Deleted

## 2017-03-27 ENCOUNTER — Telehealth: Payer: Self-pay | Admitting: Cardiology

## 2017-03-27 NOTE — Telephone Encounter (Signed)
New Message    *STAT* If patient is at the pharmacy, call can be transferred to refill team.   1. Which medications need to be refilled? (please list name of each medication and dose if known) colchicine 0.6 MG tablet  2. Which pharmacy/location (including street and city if local pharmacy) is medication to be sent to?  Take 1 tablet (0.6 mg total) by mouth 2 (two) times daily.   3. Do they need a 30 day or 90 day supply?   Pt never picked up perscription

## 2017-03-31 NOTE — Telephone Encounter (Signed)
Please give enough for him to complete therapy through 05/09/17.

## 2017-04-02 ENCOUNTER — Ambulatory Visit: Payer: 59 | Admitting: Adult Health

## 2017-04-02 DIAGNOSIS — R0989 Other specified symptoms and signs involving the circulatory and respiratory systems: Secondary | ICD-10-CM

## 2017-04-02 NOTE — Progress Notes (Deleted)
Cardiology Office Note   Date:  04/02/2017   ID:  Evan Conner, DOB 07/16/1995, MRN [id number]  PCP:  Sinda Du, MD  Cardiologist:  Hochrein  No chief complaint on file.    History of Present Illness: Evan Conner is a 22 y.o. male who presents for ongoing assessment and management of chronic chest pain with history of PE.  Hypertension, with recent evaluation in the emergency room in August 2018, he left AMA.  He did follow up with Dr. Percival Spanish on 02/11/2017 with intermittent complaints of GERD symptoms.  He has started taking aspirin and colchicine again.  Patient also has a history of substance abuse.  On the last office visit the patient was continued to be treated medically, a CRP and ESR was ordered if elevated the plan was to repeat his MRI in the setting of myocarditis.  It was felt that his heartburn was GI in etiology and he was recommended to follow-up with them.  Sed rate was low at 2.  However ERP was elevated at 2.7.    Past Medical History:  Diagnosis Date  . ADHD (attention deficit hyperactivity disorder)   . Hypertension     Past Surgical History:  Procedure Laterality Date  . TONSILLECTOMY       Current Outpatient Medications  Medication Sig Dispense Refill  . alum & mag hydroxide-simeth (MAALOX/MYLANTA) 200-200-20 MG/5ML suspension Take 10-20 mLs by mouth as needed for indigestion or heartburn.    Marland Kitchen aspirin 325 MG tablet Take 325 mg by mouth daily.    . colchicine 0.6 MG tablet Take 1 tablet (0.6 mg total) by mouth 2 (two) times daily. 60 tablet 0   No current facility-administered medications for this visit.     Allergies:   Coconut oil    Social History:  The patient  reports that he has been smoking.  He has been smoking about 1.50 packs per day. He has quit using smokeless tobacco. He reports that he uses drugs. Drug: Marijuana. He reports that he does not drink alcohol.   Family History:  The patient's family history includes CAD in his  cousin and father.    ROS: All other systems are reviewed and negative. Unless otherwise mentioned in H&P    PHYSICAL EXAM: VS:  There were no vitals taken for this visit. , BMI There is no height or weight on file to calculate BMI. GEN: Well nourished, well developed, in no acute distress HEENT: normal Neck: no JVD, carotid bruits, or masses Cardiac: ***RRR; no murmurs, rubs, or gallops,no edema  Respiratory:  clear to auscultation bilaterally, normal work of breathing GI: soft, nontender, nondistended, + BS MS: no deformity or atrophy Skin: warm and dry, no rash Neuro:  Strength and sensation are intact Psych: euthymic mood, full affect   EKG:  EKG {ACTION; IS/IS [id number]} ordered today. The ekg ordered today demonstrates ***   Recent Labs: 02/08/2017: ALT 25; BUN 6; Creatinine, Ser 1.01; Hemoglobin 14.9; Platelets 260; Potassium 3.6; Sodium 139    Lipid Panel No results found for: CHOL, TRIG, HDL, CHOLHDL, VLDL, LDLCALC, LDLDIRECT    Wt Readings from Last 3 Encounters:  02/11/17 272 lb (123.4 kg)  02/08/17 265 lb (120.2 kg)  12/21/16 275 lb (124.7 kg)      Other studies Reviewed: Additional studies/ records that were reviewed today include: ***. Review of the above records demonstrates: ***   ASSESSMENT AND PLAN:  1.  ***   Current medicines are reviewed at length with the patient  today.    Labs/ tests ordered today include: *** Phill Myron. West Pugh, ANP, AACC   04/02/2017 7:27 AM    [facility] [address] [place], [place] [address] Phone: [phone]; Fax: [phone]

## 2017-04-03 ENCOUNTER — Encounter: Payer: Self-pay | Admitting: *Deleted

## 2017-04-04 MED ORDER — COLCHICINE 0.6 MG PO TABS: 0.6000 mg | ORAL_TABLET | Freq: Two times a day (BID) | ORAL | 2 refills | Status: DC

## 2017-04-04 NOTE — Telephone Encounter (Signed)
Rx has been sent to the pharmacy electronically. ° °

## 2017-04-15 ENCOUNTER — Emergency Department
Admission: EM | Admit: 2017-04-15 | Discharge: 2017-04-16 | Disposition: A | Discharge status: Home / Self Care | Payer: 59 | Attending: Emergency Medicine | Admitting: Emergency Medicine

## 2017-04-15 ENCOUNTER — Emergency Department: Payer: 59

## 2017-04-15 ENCOUNTER — Encounter: Payer: Self-pay | Admitting: Emergency Medicine

## 2017-04-15 ENCOUNTER — Other Ambulatory Visit: Payer: Self-pay

## 2017-04-15 DIAGNOSIS — R1084 Generalized abdominal pain: Secondary | ICD-10-CM

## 2017-04-15 DIAGNOSIS — R197 Diarrhea, unspecified: Secondary | ICD-10-CM | POA: Diagnosis not present

## 2017-04-15 DIAGNOSIS — I10 Essential (primary) hypertension: Secondary | ICD-10-CM | POA: Diagnosis not present

## 2017-04-15 DIAGNOSIS — Z7982 Long term (current) use of aspirin: Secondary | ICD-10-CM | POA: Insufficient documentation

## 2017-04-15 DIAGNOSIS — F17200 Nicotine dependence, unspecified, uncomplicated: Secondary | ICD-10-CM | POA: Diagnosis not present

## 2017-04-15 DIAGNOSIS — R112 Nausea with vomiting, unspecified: Secondary | ICD-10-CM | POA: Insufficient documentation

## 2017-04-15 LAB — URINALYSIS, COMPLETE (UACMP) WITH MICROSCOPIC
BILIRUBIN URINE: NEGATIVE
Bacteria, UA: NONE SEEN
Glucose, UA: NEGATIVE mg/dL
HGB URINE DIPSTICK: NEGATIVE
Ketones, ur: NEGATIVE mg/dL
LEUKOCYTES UA: NEGATIVE
NITRITE: NEGATIVE
PROTEIN: NEGATIVE mg/dL
SPECIFIC GRAVITY, URINE: 1.015 (ref 1.005–1.030)
SQUAMOUS EPITHELIAL / LPF: NONE SEEN
pH: 7 (ref 5.0–8.0)

## 2017-04-15 LAB — COMPREHENSIVE METABOLIC PANEL
ALBUMIN: 4 g/dL (ref 3.5–5.0)
ALT: 37 U/L (ref 17–63)
ANION GAP: 11 (ref 5–15)
AST: 30 U/L (ref 15–41)
Alkaline Phosphatase: 52 U/L (ref 38–126)
BUN: 8 mg/dL (ref 6–20)
CO2: 26 mmol/L (ref 22–32)
Calcium: 9 mg/dL (ref 8.9–10.3)
Chloride: 95 mmol/L — ABNORMAL LOW (ref 101–111)
Creatinine, Ser: 0.9 mg/dL (ref 0.61–1.24)
GFR calc Af Amer: >60 mL/min (ref >60)
GFR calc non Af Amer: >60 mL/min (ref >60)
GLUCOSE: 140 mg/dL — AB (ref 65–99)
POTASSIUM: 3.6 mmol/L (ref 3.5–5.1)
SODIUM: 132 mmol/L — AB (ref 135–145)
TOTAL PROTEIN: 7.7 g/dL (ref 6.5–8.1)
Total Bilirubin: 0.6 mg/dL (ref 0.3–1.2)

## 2017-04-15 LAB — CBC
HEMATOCRIT: 46.8 % (ref 40.0–52.0)
HEMOGLOBIN: 16.3 g/dL (ref 13.0–18.0)
MCH: 29.2 pg (ref 26.0–34.0)
MCHC: 34.8 g/dL (ref 32.0–36.0)
MCV: 84 fL (ref 80.0–100.0)
Platelets: 326 10*3/uL (ref 150–440)
RBC: 5.57 MIL/uL (ref 4.40–5.90)
RDW: 12.8 % (ref 11.5–14.5)
WBC: 11.8 10*3/uL — ABNORMAL HIGH (ref 3.8–10.6)

## 2017-04-15 LAB — TROPONIN I

## 2017-04-15 LAB — LIPASE, BLOOD: LIPASE: 28 U/L (ref 11–51)

## 2017-04-15 MED ORDER — ONDANSETRON HCL 4 MG PO TABS: 4.0000 mg | ORAL_TABLET | Freq: Every day | ORAL | 0 refills | Status: DC | PRN

## 2017-04-15 MED ORDER — SODIUM CHLORIDE 0.9 % IV BOLUS (SEPSIS)
1000.0000 mL | Freq: Once | INTRAVENOUS | Status: AC
Start: 2017-04-15 — End: 2017-04-15
  Administered 2017-04-15: 1000 mL via INTRAVENOUS

## 2017-04-15 MED ORDER — OXYCODONE-ACETAMINOPHEN 5-325 MG PO TABS
1.0000 | ORAL_TABLET | Freq: Once | ORAL | Status: AC
Start: 2017-04-16 — End: 2017-04-16
  Administered 2017-04-16: 1 via ORAL

## 2017-04-15 MED ORDER — DICYCLOMINE HCL 20 MG PO TABS: 20.0000 mg | ORAL_TABLET | Freq: Three times a day (TID) | ORAL | 0 refills | Status: DC | PRN

## 2017-04-15 MED ORDER — OXYCODONE-ACETAMINOPHEN 5-325 MG PO TABS
ORAL_TABLET | ORAL | Status: AC
  Filled 2017-04-15: qty 1

## 2017-04-15 MED ORDER — ONDANSETRON HCL 4 MG/2ML IJ SOLN
4.0000 mg | Freq: Once | INTRAMUSCULAR | Status: AC
  Administered 2017-04-15: 4 mg via INTRAVENOUS
  Filled 2017-04-15: qty 2

## 2017-04-15 MED ORDER — IOPAMIDOL (ISOVUE-300) INJECTION 61%
100.0000 mL | Freq: Once | INTRAVENOUS | Status: AC | PRN
  Administered 2017-04-15: 100 mL via INTRAVENOUS
  Filled 2017-04-15: qty 100

## 2017-04-15 MED ORDER — MORPHINE SULFATE (PF) 4 MG/ML IV SOLN
4.0000 mg | Freq: Once | INTRAVENOUS | Status: AC
  Administered 2017-04-15: 4 mg via INTRAVENOUS
  Filled 2017-04-15: qty 1

## 2017-04-15 NOTE — ED Provider Notes (Signed)
[facility] Emergency Department Provider Note  ____________________________________________   First MD Initiated Contact with Patient 04/15/17 2203     (approximate)  I have reviewed the triage vital signs and the nursing notes.   HISTORY  Chief Complaint Abdominal Pain and Emesis   HPI Evan Conner is a 22 y.o. male with history of ADHD as well as myocarditis and opiate abuse who is presenting to the emergency department today with 2 days of epigastric as well as left sided abdominal pain. He says that it is associated with nausea vomiting and diarrhea. Says that he has not any blood in the vomitus nor stool. Says the pain is a 10 attendant sharp now nonradiating. Denies any chest pain or shortness of breath as he had previously with his myocarditis. Patient says that he has not any opiates in the past 3 weeks. No known sick contacts.   Past Medical History:  Diagnosis Date  . ADHD (attention deficit hyperactivity disorder)   . Hypertension     Patient Active Problem List   Diagnosis Date Noted  . Myocarditis (HCC) 02/11/2017  . Syncope 02/11/2017  . Adjustment disorder with disturbance of emotion 12/21/2016  . Chest pain 08/31/2016  . Elevated troponin 08/31/2016  . Fever   . Acute viral pericarditis     Past Surgical History:  Procedure Laterality Date  . TONSILLECTOMY    . WRIST SURGERY Right     Prior to Admission medications   Medication Sig Start Date End Date Taking? Authorizing Provider  alum & mag hydroxide-simeth (MAALOX/MYLANTA) 200-200-20 MG/5ML suspension Take 10-20 mLs by mouth as needed for indigestion or heartburn.    [provider]  aspirin 325 MG tablet Take 325 mg by mouth daily.    [provider]  colchicine 0.6 MG tablet Take 1 tablet (0.6 mg total) by mouth 2 (two) times daily. 04/04/17   Rollene Rotunda, MD    Allergies Coconut oil  Family History  Problem Relation Age of Onset  . CAD  Father        1st MI at age 49  . CAD Cousin     Social History Social History   Tobacco Use  . Smoking status: Current Every Day Smoker    Packs/day: 1.50  . Smokeless tobacco: Former Engineer, water Use Topics  . Alcohol use: No    Frequency: Never  . Drug use: Yes    Types: Marijuana    Review of Systems  Constitutional: No fever/chills Eyes: No visual changes. ENT: No sore throat. Cardiovascular: Denies chest pain. Respiratory: Denies shortness of breath. Gastrointestinal:   No constipation. Genitourinary: Negative for dysuria. Musculoskeletal: Negative for back pain. Skin: Negative for rash. Neurological: Negative for headaches, focal weakness or numbness.   ____________________________________________   PHYSICAL EXAM:  VITAL SIGNS: ED Triage Vitals [04/15/17 2033]  Enc Vitals Group     BP (!) 161/90     Pulse Rate 79     Resp 20     Temp 97.7 F (36.5 C)     Temp Source Oral     SpO2 98 %     Weight 270 lb (122.5 kg)     Height 6' (1.829 m)     Head Circumference      Peak Flow      Pain Score 8     Pain Loc      Pain Edu?      Excl. in GC?     Constitutional: Alert  and oriented. as I enter the room the patient is retching into the laundry basket. However, no vomitus comes up. He is dry heaving. He looks very uncomfortable. Eyes: Conjunctivae are normal.  Head: Atraumatic. Nose: No congestion/rhinnorhea. Mouth/Throat: Mucous membranes are moist.  Neck: No stridor.   Cardiovascular: Normal rate, regular rhythm. Grossly normal heart sounds.  Respiratory: Normal respiratory effort.  No retractions. Lungs CTAB. Gastrointestinal: Soft with severe tenderness and guarding to the upper abdomen as well as the left lower quadrant. Minimal tenderness to the right lower quadrant without rebound or guarding. No distention. No CVA tenderness. Musculoskeletal: No lower extremity tenderness nor edema.  No joint effusions. Neurologic:  Normal speech and  language. No gross focal neurologic deficits are appreciated. Skin:  Skin is warm, dry and intact. No rash noted. Psychiatric: Mood and affect are normal. Speech and behavior are normal.  ____________________________________________   LABS (all labs ordered are listed, but only abnormal results are displayed)  Labs Reviewed  COMPREHENSIVE METABOLIC PANEL - Abnormal; Notable for the following components:      Result Value   Sodium 132 (*)    Chloride 95 (*)    Glucose, Bld 140 (*)    All other components within normal limits  CBC - Abnormal; Notable for the following components:   WBC 11.8 (*)    All other components within normal limits  URINALYSIS, COMPLETE (UACMP) WITH MICROSCOPIC - Abnormal; Notable for the following components:   Color, Urine YELLOW (*)    APPearance HAZY (*)    All other components within normal limits  LIPASE, BLOOD  TROPONIN I   ____________________________________________  EKG  ED ECG REPORT I, Arelia Longestavid M Schaevitz, the attending physician, personally viewed and interpreted this ECG.   Date: 04/15/2017  EKG Time: 2300  Rate: 56  Rhythm: sinus bradycardia  Axis: normal  Intervals:none  ST&T Change: no ST segment elevation or depression. No abnormal T-wave inversion.  ____________________________________________  RADIOLOGY  no acute finding on the CT of the abdomen and pelvis. ____________________________________________   PROCEDURES  Procedure(s) performed:   Procedures  Critical Care performed:   ____________________________________________   INITIAL IMPRESSION / ASSESSMENT AND PLAN / ED COURSE  Pertinent labs & imaging results that were available during my care of the patient were reviewed by me and considered in my medical decision making (see chart for details).  Differential diagnosis includes, but is not limited to, biliary disease (biliary colic, acute cholecystitis, cholangitis, choledocholithiasis, etc), intrathoracic  causes for epigastric abdominal pain including ACS, gastritis, duodenitis, pancreatitis, small bowel or large bowel obstruction, abdominal aortic aneurysm, hernia, and gastritis. Differential diagnosis includes, but is not limited to, acute appendicitis, renal colic, testicular torsion, urinary tract infection/pyelonephritis, prostatitis,  epididymitis, diverticulitis, small bowel obstruction or ileus, colitis, abdominal aortic aneurysm, gastroenteritis, hernia, etc. As part of my medical decision making, I reviewed the following data within the electronic MEDICAL RECORD NUMBER Notes from prior ED visits. Patient was recently in the emergency department in January for chest pain. It appeared the left AGAINST MEDICAL ADVICE as he did in 2018 when he was admitted for myopericarditis and left AGAINST MEDICAL ADVICE.  ----------------------------------------- 11:47 PM on 04/15/2017 -----------------------------------------  Patient at this time with reassuring CAT scan.he is lying down on the stretcher without distress. I updated the patient has to his CAT scan results. He is now requesting something to eat and drink. He is tolerating by mouth fluids as well as saltines. He is also suspected he may have had food  poisoning as he says that someone else with the same pizzas he did yesterday had diarrhea but did not develop vomiting that he had. I reexamined his abdomen while he is still moderately tender he is not guarding at this time. He is not distressed like he was before." We'll give him Zofran as well as Bentyl to go home. He is understanding the plan and willing to comply. ____________________________________________   FINAL CLINICAL IMPRESSION(S) / ED DIAGNOSES  abdominal pain with nausea vomiting and diarrhea.    NEW MEDICATIONS STARTED DURING THIS VISIT:  New Prescriptions   No medications on file     Note:  This document was prepared using Dragon voice recognition software and may include  unintentional dictation errors.     Myrna Blazer, MD 04/15/17 [id number]

## 2017-04-15 NOTE — ED Triage Notes (Addendum)
Patient ambulatory to triage with steady gait, without difficulty or distress noted; pt reports N/V since yesterday with generalized abd pain; pt st "I think I have food poisoning"; pt asking for ice chips; instructed to remain NPO until evaluated by provider

## 2017-04-15 NOTE — ED Notes (Signed)
Patient c/o mid abdominal beginning yesterday. Patient reports multiple emeses beginning yesterday. Patient tender to palpation. Patient reports 1 normal BM yesterday.

## 2017-04-15 NOTE — ED Notes (Signed)
Patient transported to CT 

## 2017-10-10 ENCOUNTER — Emergency Department
Admission: EM | Admit: 2017-10-10 | Discharge: 2017-10-10 | Disposition: A | Discharge status: Home / Self Care | Payer: 59 | Attending: Emergency Medicine | Admitting: Emergency Medicine

## 2017-10-10 ENCOUNTER — Encounter: Payer: Self-pay | Admitting: Emergency Medicine

## 2017-10-10 DIAGNOSIS — Y92410 Unspecified street and highway as the place of occurrence of the external cause: Secondary | ICD-10-CM | POA: Diagnosis not present

## 2017-10-10 DIAGNOSIS — Y9389 Activity, other specified: Secondary | ICD-10-CM | POA: Insufficient documentation

## 2017-10-10 DIAGNOSIS — S39012A Strain of muscle, fascia and tendon of lower back, initial encounter: Secondary | ICD-10-CM | POA: Insufficient documentation

## 2017-10-10 DIAGNOSIS — S3992XA Unspecified injury of lower back, initial encounter: Secondary | ICD-10-CM | POA: Diagnosis present

## 2017-10-10 DIAGNOSIS — Z7982 Long term (current) use of aspirin: Secondary | ICD-10-CM | POA: Insufficient documentation

## 2017-10-10 DIAGNOSIS — I10 Essential (primary) hypertension: Secondary | ICD-10-CM | POA: Insufficient documentation

## 2017-10-10 DIAGNOSIS — Y999 Unspecified external cause status: Secondary | ICD-10-CM | POA: Diagnosis not present

## 2017-10-10 DIAGNOSIS — Z79899 Other long term (current) drug therapy: Secondary | ICD-10-CM | POA: Diagnosis not present

## 2017-10-10 DIAGNOSIS — F17200 Nicotine dependence, unspecified, uncomplicated: Secondary | ICD-10-CM | POA: Insufficient documentation

## 2017-10-10 HISTORY — DX: Myocarditis, unspecified: I51.4

## 2017-10-10 MED ORDER — CYCLOBENZAPRINE HCL 10 MG PO TABS: 10.0000 mg | ORAL_TABLET | Freq: Three times a day (TID) | ORAL | 0 refills | Status: DC | PRN

## 2017-10-10 MED ORDER — TRAMADOL HCL 50 MG PO TABS: 50.0000 mg | ORAL_TABLET | Freq: Two times a day (BID) | ORAL | 0 refills | Status: DC | PRN

## 2017-10-10 MED ORDER — IBUPROFEN 800 MG PO TABS: 800.0000 mg | ORAL_TABLET | Freq: Three times a day (TID) | ORAL | 0 refills | Status: DC | PRN
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
## 2017-10-15 ENCOUNTER — Other Ambulatory Visit: Payer: Self-pay

## 2017-10-15 ENCOUNTER — Emergency Department (HOSPITAL_COMMUNITY): Payer: 59

## 2017-10-15 ENCOUNTER — Emergency Department (HOSPITAL_COMMUNITY)
Admission: EM | Admit: 2017-10-15 | Discharge: 2017-10-15 | Disposition: A | Discharge status: Home / Self Care | Payer: 59 | Attending: Emergency Medicine | Admitting: Emergency Medicine

## 2017-10-15 ENCOUNTER — Encounter (HOSPITAL_COMMUNITY): Payer: Self-pay | Admitting: Emergency Medicine

## 2017-10-15 DIAGNOSIS — I10 Essential (primary) hypertension: Secondary | ICD-10-CM | POA: Insufficient documentation

## 2017-10-15 DIAGNOSIS — F909 Attention-deficit hyperactivity disorder, unspecified type: Secondary | ICD-10-CM | POA: Diagnosis not present

## 2017-10-15 DIAGNOSIS — R0789 Other chest pain: Secondary | ICD-10-CM | POA: Diagnosis present

## 2017-10-15 DIAGNOSIS — Z79899 Other long term (current) drug therapy: Secondary | ICD-10-CM | POA: Insufficient documentation

## 2017-10-15 DIAGNOSIS — F17210 Nicotine dependence, cigarettes, uncomplicated: Secondary | ICD-10-CM | POA: Insufficient documentation

## 2017-10-15 DIAGNOSIS — R079 Chest pain, unspecified: Secondary | ICD-10-CM

## 2017-10-15 DIAGNOSIS — Z7982 Long term (current) use of aspirin: Secondary | ICD-10-CM | POA: Diagnosis not present

## 2017-10-15 LAB — CBC
HEMATOCRIT: 45.7 % (ref 39.0–52.0)
Hemoglobin: 14.9 g/dL (ref 13.0–17.0)
MCH: 28.7 pg (ref 26.0–34.0)
MCHC: 32.6 g/dL (ref 30.0–36.0)
MCV: 87.9 fL (ref 78.0–100.0)
Platelets: 280 10*3/uL (ref 150–400)
RBC: 5.2 MIL/uL (ref 4.22–5.81)
RDW: 12.2 % (ref 11.5–15.5)
WBC: 7.8 10*3/uL (ref 4.0–10.5)

## 2017-10-15 LAB — I-STAT TROPONIN, ED
Troponin i, poc: 0 ng/mL (ref 0.00–0.08)
Troponin i, poc: 0 ng/mL (ref 0.00–0.08)

## 2017-10-15 LAB — BASIC METABOLIC PANEL
Anion gap: 8 (ref 5–15)
BUN: 12 mg/dL (ref 6–20)
CO2: 24 mmol/L (ref 22–32)
Calcium: 8.9 mg/dL (ref 8.9–10.3)
Chloride: 106 mmol/L (ref 98–111)
Creatinine, Ser: 0.84 mg/dL (ref 0.61–1.24)
GFR calc Af Amer: >60 mL/min (ref >60)
GLUCOSE: 110 mg/dL — AB (ref 70–99)
POTASSIUM: 4.3 mmol/L (ref 3.5–5.1)
Sodium: 138 mmol/L (ref 135–145)

## 2017-10-15 LAB — HEPATIC FUNCTION PANEL
ALBUMIN: 3.4 g/dL — AB (ref 3.5–5.0)
ALK PHOS: 47 U/L (ref 38–126)
ALT: 31 U/L (ref 0–44)
AST: 31 U/L (ref 15–41)
Bilirubin, Direct: 0.4 mg/dL — ABNORMAL HIGH (ref 0.0–0.2)
Indirect Bilirubin: 0.9 mg/dL (ref 0.3–0.9)
TOTAL PROTEIN: 6.1 g/dL — AB (ref 6.5–8.1)
Total Bilirubin: 1.3 mg/dL — ABNORMAL HIGH (ref 0.3–1.2)

## 2017-10-15 LAB — LIPASE, BLOOD: Lipase: 26 U/L (ref 11–51)

## 2017-10-15 MED ORDER — GI COCKTAIL ~~LOC~~
30.0000 mL | Freq: Once | ORAL | Status: AC
  Administered 2017-10-15: 30 mL via ORAL
  Filled 2017-10-15: qty 30

## 2017-10-15 MED ORDER — KETOROLAC TROMETHAMINE 30 MG/ML IJ SOLN
30.0000 mg | Freq: Once | INTRAMUSCULAR | Status: AC
  Administered 2017-10-15: 30 mg via INTRAVENOUS
  Filled 2017-10-15: qty 1

## 2017-10-15 MED ORDER — FAMOTIDINE IN NACL 20-0.9 MG/50ML-% IV SOLN
20.0000 mg | Freq: Once | INTRAVENOUS | Status: AC
  Administered 2017-10-15: 20 mg via INTRAVENOUS
  Filled 2017-10-15: qty 50

## 2017-10-15 MED ORDER — OMEPRAZOLE 20 MG PO CPDR: 20.0000 mg | DELAYED_RELEASE_CAPSULE | Freq: Every day | ORAL | 1 refills | Status: DC

## 2017-10-15 MED ORDER — NAPROXEN 375 MG PO TABS: 375.0000 mg | ORAL_TABLET | Freq: Two times a day (BID) | ORAL | 0 refills | Status: DC

## 2017-10-15 NOTE — ED Triage Notes (Signed)
Pt arrives to ED from home with complaints of Chest Pain since this morning around 0700. EMS reports PT has hx of 4 known MI starting at age 22, at rest. Pt states he wakes up every morning with chest tightness but never pain. This morning he woke up with tightness and pain in the center of his chest. Pt has refused any type of cardiac cath tx in the past. Pt is a smoker and non compliant with his home meds for his heart. Hx of myocarditis.  Pt pain was at 9 (0/10) EMS gave 3 nitro and pt pain was relieved after nitro #3. Pt took 325 aspirin at home before EMS arrival. Pt currently has no chest pain. Pt placed in position of comfort with bed locked and lowered, call bell in reach.

## 2017-10-15 NOTE — ED Provider Notes (Signed)
[facility] EMERGENCY DEPARTMENT Provider Note   CSN: [id number] Arrival date & time: 10/15/17  4098     History   Chief Complaint Chief Complaint  Patient presents with  . Chest Pain    HPI Evan Conner is a 22 y.o. male.  HPI Patient reports that he always has chest pain.  There is chronically a discomfort across his anterior chest.  Today however he reports he got a much more intense and sharper pain that came up the center of his chest and radiated slightly off to the right.  Pain was resolved by administration of nitroglycerin and aspirin by EMS.  It has however been waxing and waning.  He denies any recent fever or cough.  No lower extremity swelling or calf pain.  Patient does smoke.  He has prior history of viral pericarditis/myocarditis. Past Medical History:  Diagnosis Date  . ADHD (attention deficit hyperactivity disorder)   . Hypertension   . Myocarditis [facility])     Patient Active Problem List   Diagnosis Date Noted  . Myocarditis (HCC) 02/11/2017  . Syncope 02/11/2017  . Adjustment disorder with disturbance of emotion 12/21/2016  . Chest pain 08/31/2016  . Elevated troponin 08/31/2016  . Fever   . Acute viral pericarditis     Past Surgical History:  Procedure Laterality Date  . TONSILLECTOMY    . WRIST SURGERY Right         Home Medications    Prior to Admission medications   Medication Sig Start Date End Date Taking? Authorizing Provider  alum & mag hydroxide-simeth (MAALOX/MYLANTA) 200-200-20 MG/5ML suspension Take 10-20 mLs by mouth as needed for indigestion or heartburn.    [provider]  aspirin 325 MG tablet Take 325 mg by mouth daily.    [provider]  colchicine 0.6 MG tablet Take 1 tablet (0.6 mg total) by mouth 2 (two) times daily. 04/04/17   Rollene Rotunda, MD  cyclobenzaprine (FLEXERIL) 10 MG tablet Take 1 tablet (10 mg total) by mouth 3 (three) times daily as needed. 10/10/17   Joni Reining,  PA-C  dicyclomine (BENTYL) 20 MG tablet Take 1 tablet (20 mg total) by mouth 3 (three) times daily as needed for spasms. 04/15/17 04/15/18  Myrna Blazer, MD  ibuprofen (ADVIL,MOTRIN) 800 MG tablet Take 1 tablet (800 mg total) by mouth every 8 (eight) hours as needed for moderate pain. 10/10/17   Joni Reining, PA-C  naproxen (NAPROSYN) 375 MG tablet Take 1 tablet (375 mg total) by mouth 2 (two) times daily. 10/15/17   Arby Barrette, MD  omeprazole (PRILOSEC) 20 MG capsule Take 1 capsule (20 mg total) by mouth daily. 10/15/17   Arby Barrette, MD  ondansetron (ZOFRAN) 4 MG tablet Take 1 tablet (4 mg total) by mouth daily as needed. 04/15/17   Schaevitz, Myra Rude, MD  traMADol (ULTRAM) 50 MG tablet Take 1 tablet (50 mg total) by mouth every 12 (twelve) hours as needed. 10/10/17   Joni Reining, PA-C    Family History Family History  Problem Relation Age of Onset  . CAD Father        1st MI at age 77  . CAD Cousin     Social History Social History   Tobacco Use  . Smoking status: Current Every Day Smoker    Packs/day: 1.50  . Smokeless tobacco: Former Engineer, water Use Topics  . Alcohol use: No    Frequency: Never  . Drug use: Yes  Types: Marijuana     Allergies   Coconut oil   Review of Systems Review of Systems 10 Systems reviewed and are negative for acute change except as noted in the HPI.   Physical Exam Updated Vital Signs BP 113/62   Pulse 67   Temp 97.6 F (36.4 C) (Oral)   Resp 17   Ht 6' (1.829 m)   Wt 124.7 kg   SpO2 99%   BMI 37.30 kg/m   Physical Exam  Constitutional: He is oriented to person, place, and time. He appears well-developed and well-nourished.  Patient is clinically well in appearance.  Nontoxic alert no respiratory distress.  HENT:  Head: Normocephalic and atraumatic.  Mouth/Throat: Oropharynx is clear and moist.  Eyes: EOM are normal.  Neck: Neck supple.  Cardiovascular: Normal rate, regular rhythm, normal  heart sounds and intact distal pulses.  Pulmonary/Chest: Effort normal and breath sounds normal. He exhibits tenderness.  Patient winces with pain with compression along the right sternal border.  Abdominal: Soft. Bowel sounds are normal. He exhibits no distension. There is no tenderness.  Musculoskeletal: Normal range of motion. He exhibits no edema or tenderness.  Calves are soft and nontender.  No peripheral edema.  Neurological: He is alert and oriented to person, place, and time. He has normal strength. Coordination normal. GCS eye subscore is 4. GCS verbal subscore is 5. GCS motor subscore is 6.  Skin: Skin is warm, dry and intact.  Psychiatric: He has a normal mood and affect.     ED Treatments / Results  Labs (all labs ordered are listed, but only abnormal results are displayed) Labs Reviewed  BASIC METABOLIC PANEL - Abnormal; Notable for the following components:      Result Value   Glucose, Bld 110 (*)    All other components within normal limits  HEPATIC FUNCTION PANEL - Abnormal; Notable for the following components:   Total Protein 6.1 (*)    Albumin 3.4 (*)    Total Bilirubin 1.3 (*)    Bilirubin, Direct 0.4 (*)    All other components within normal limits  CBC  LIPASE, BLOOD  I-STAT TROPONIN, ED  I-STAT TROPONIN, ED    EKG EKG Interpretation  Date/Time:  Tuesday October 15 2017 09:36:06 EDT Ventricular Rate:  62 PR Interval:    QRS Duration: 110 QT Interval:  412 QTC Calculation: 419 R Axis:   77 Text Interpretation:  Sinus rhythm normal no change Confirmed by Arby BarrettePfeiffer, Davi Rotan [phone]) on 10/15/2017 9:38:36 AM Also confirmed by Arby BarrettePfeiffer, Dorothye Berni [phone]), editor Barbette Hairassel, Kerry [phone])  on 10/15/2017 11:11:44 AM   Radiology Dg Chest 2 View  Result Date: 10/15/2017 CLINICAL DATA:  Chest pain EXAM: CHEST - 2 VIEW COMPARISON:  02/08/2017 FINDINGS: Heart and mediastinal contours are within normal limits. No focal opacities or effusions. No acute bony abnormality.  IMPRESSION: No active cardiopulmonary disease. Electronically Signed   By: Charlett NoseKevin  Dover M.D.   On: 10/15/2017 10:04    Procedures Procedures (including critical care time)  Medications Ordered in ED Medications  ketorolac (TORADOL) 30 MG/ML injection 30 mg (30 mg Intravenous Given 10/15/17 1108)  famotidine (PEPCID) IVPB 20 mg premix (0 mg Intravenous Stopped 10/15/17 1332)  gi cocktail (Maalox,Lidocaine,Donnatal) (30 mLs Oral Given 10/15/17 1259)     Initial Impression / Assessment and Plan / ED Course  I have reviewed the triage vital signs and the nursing notes.  Pertinent labs & imaging results that were available during my care of the patient were reviewed  by me and considered in my medical decision making (see chart for details).    Patient presents reporting chronic daily chest pain.  Today it was different and that he had a more intense sharp pain at the center of his chest that radiated off to the right.  This sounds to be low probability to be cardiac ischemic in nature.  EKG does not show change compared to previous.  2 sets of cardiac enzymes are negative.  Patient's chest pain is quite reproducible on exam.  Auscultation does not reveal any rubs or murmurs.  I have low suspicion for primary cardiac etiology today.  Plan will be to treat with daily Prilosec and low dose of naproxen for suspected chest wall pain.  Possible GERD with esophageal spasm.  Patient is given instructions to modify lifestyle and diet as well.  He is counseled to follow-up with his PCP this week with return precautions reviewed.  Final Clinical Impressions(s) / ED Diagnoses   Final diagnoses:  Chest wall pain  Nonspecific chest pain    ED Discharge Orders         Ordered    omeprazole (PRILOSEC) 20 MG capsule  Daily     10/15/17 1549    naproxen (NAPROSYN) 375 MG tablet  2 times daily     10/15/17 1549           Arby Barrette, MD 10/15/17 1559

## 2017-10-30 ENCOUNTER — Ambulatory Visit: Payer: 59 | Admitting: Physician Assistant

## 2017-10-30 DIAGNOSIS — R0989 Other specified symptoms and signs involving the circulatory and respiratory systems: Secondary | ICD-10-CM

## 2017-10-31 ENCOUNTER — Encounter: Payer: Self-pay | Admitting: *Deleted

## 2017-11-08 ENCOUNTER — Ambulatory Visit: Payer: 59 | Admitting: Physician Assistant

## 2017-11-11 ENCOUNTER — Encounter: Payer: Self-pay | Admitting: *Deleted

## 2019-02-24 ENCOUNTER — Other Ambulatory Visit: Payer: Self-pay

## 2019-02-24 ENCOUNTER — Encounter (HOSPITAL_COMMUNITY): Payer: Self-pay | Admitting: Emergency Medicine

## 2019-02-24 ENCOUNTER — Emergency Department (HOSPITAL_COMMUNITY)
Admission: EM | Admit: 2019-02-24 | Discharge: 2019-02-25 | Disposition: A | Discharge status: Home / Self Care | Payer: 59 | Attending: Emergency Medicine | Admitting: Emergency Medicine

## 2019-02-24 DIAGNOSIS — R071 Chest pain on breathing: Secondary | ICD-10-CM | POA: Insufficient documentation

## 2019-02-24 DIAGNOSIS — Z5321 Procedure and treatment not carried out due to patient leaving prior to being seen by health care provider: Secondary | ICD-10-CM | POA: Diagnosis not present

## 2019-02-24 DIAGNOSIS — R079 Chest pain, unspecified: Secondary | ICD-10-CM | POA: Insufficient documentation

## 2019-02-24 MED ORDER — SODIUM CHLORIDE 0.9% FLUSH: 3.0000 mL | Freq: Once | INTRAVENOUS | Status: DC

## 2019-02-24 NOTE — ED Triage Notes (Addendum)
Pt BIB [facility] from home, c/o central chest pain that started tonight. Reports pain worse on inspiration. Denies shortness of breath. Pt reports hx MI, given 324mg  aspirin pta.

## 2019-02-24 NOTE — Progress Notes (Signed)
Cardiology Office Note   Date:  02/25/2019   ID:  Evan Conner, DOB December 02, 1995, MRN [id number]  PCP:  Sinda Du, MD  Cardiologist:   Minus Breeding, MD   Chief Complaint  Patient presents with  . Chest Pain      History of Present Illness: Evan Conner is a 24 y.o. male who presents for evaluation of chest pain.   He has been treated for myocarditis in 2018.   He was last in the ED in Sept 2019 with chest pain felt to be non anginal.  He had has had a couple of no shows in our office.   He was actually in the emergency room yesterday.  I reviewed these records.  High-sensitivity troponin was negative x2.  Potassium was slightly low.  EKG demonstrated no acute abnormalities.  He says that the pain came on suddenly.  He has his pain frequently but this was more severe.  It was mid chest.  He might notice it after he has a particularly exerting day.  But it does not necessarily happen with exertion.  It comes and goes.  It seems to be similar to his previous myocarditis but not as severe.  It might last 3 to 10 minutes.  He might stretch and it helps a tiny little bit.  He does not describe associated nausea vomiting or diaphoresis.  Is not had any new palpitations, presyncope or syncope.  He feels like he does not have the exercise tolerance he did previously.  He feels fatigued.   Past Medical History:  Diagnosis Date  . ADHD (attention deficit hyperactivity disorder)   . Hypertension   . Myocarditis [facility])     Past Surgical History:  Procedure Laterality Date  . TONSILLECTOMY    . WRIST SURGERY Right      Current Outpatient Medications  Medication Sig Dispense Refill  . aspirin 81 MG chewable tablet Chew 81 mg by mouth daily.     No current facility-administered medications for this visit.    Allergies:   Coconut oil     ROS:  Please see the history of present illness.   Otherwise, review of systems are positive for none.   All other systems are reviewed  and negative.    PHYSICAL EXAM: VS:  BP 121/76   Pulse 68   Temp (!) 97 F (36.1 C)   Ht 6' (1.829 m)   Wt 295 lb 9.6 oz (134.1 kg)   SpO2 97%   BMI 40.09 kg/m  , BMI Body mass index is 40.09 kg/m. GENERAL:  Well appearing NECK:  No jugular venous distention, waveform within normal limits, carotid upstroke brisk and symmetric, no bruits, no thyromegaly LUNGS:  Clear to auscultation bilaterally CHEST:  Unremarkable HEART:  PMI not displaced or sustained,S1 and S2 within normal limits, no S3, no S4, no clicks, no rubs, no murmurs ABD:  Flat, positive bowel sounds normal in frequency in pitch, no bruits, no rebound, no guarding, no midline pulsatile mass, no hepatomegaly, no splenomegaly EXT:  2 plus pulses throughout, no edema, no cyanosis no clubbing   EKG:  EKG is not  ordered today. The ekg ordered 02/24/19 demonstrates sinus rhythm, rate 72, axis within normal limits, intervals within normal limits, no acute ST-T wave changes.   Recent Labs:  02/24/2019: BUN 16; Creatinine, Ser 0.94; Hemoglobin 14.2; Platelets 290; Potassium 3.4; Sodium 138    Lipid Panel No results found for: CHOL, TRIG, HDL, CHOLHDL, VLDL, LDLCALC, LDLDIRECT  Wt Readings from Last 3 Encounters:  02/25/19 295 lb 9.6 oz (134.1 kg)  10/15/17 275 lb (124.7 kg)  10/10/17 270 lb (122.5 kg)      Other studies Reviewed: Additional studies/ records that were reviewed today include: fED records. Review of the above records demonstrates:  Please see elsewhere in the note.     ASSESSMENT AND PLAN:   MYOCARDITIS: The patient has ongoing chest pain.  The patient has normal high-sensitivity.  Unremarkable.  I will check a sedimentation rate and a CRP as well as an echocardiogram.  However, I do not strongly suspect ongoing myocarditis.  If this is normal I would refer him to GI.  FATIGUE: He complains of this.  CBC was normal.  I will check a TSH.  Current medicines are reviewed at length with the patient  today.  The patient does not have concerns regarding medicines.  The following changes have been made:  None  Labs/ tests ordered today include:   Orders Placed This Encounter  Procedures  . Sed Rate (ESR)  . C-reactive protein  . TSH  . ECHOCARDIOGRAM COMPLETE     Disposition:   FU with as me as needed.  Ronnell Guadalajara, MD  02/25/2019 5:26 PM    [place]

## 2019-02-25 ENCOUNTER — Emergency Department (HOSPITAL_COMMUNITY): Payer: 59

## 2019-02-25 ENCOUNTER — Ambulatory Visit (INDEPENDENT_AMBULATORY_CARE_PROVIDER_SITE_OTHER): Payer: 59 | Admitting: Cardiology

## 2019-02-25 ENCOUNTER — Encounter: Payer: Self-pay | Admitting: Cardiology

## 2019-02-25 VITALS — BP 121/76 | HR 68 | Temp 97.0°F | Ht 72.0 in | Wt 295.6 lb

## 2019-02-25 DIAGNOSIS — R079 Chest pain, unspecified: Secondary | ICD-10-CM | POA: Diagnosis not present

## 2019-02-25 LAB — BASIC METABOLIC PANEL
Anion gap: 9 (ref 5–15)
BUN: 16 mg/dL (ref 6–20)
CO2: 26 mmol/L (ref 22–32)
Calcium: 8.9 mg/dL (ref 8.9–10.3)
Chloride: 103 mmol/L (ref 98–111)
Creatinine, Ser: 0.94 mg/dL (ref 0.61–1.24)
GFR calc Af Amer: >60 mL/min (ref >60)
GFR calc non Af Amer: >60 mL/min (ref >60)
Glucose, Bld: 109 mg/dL — ABNORMAL HIGH (ref 70–99)
Potassium: 3.4 mmol/L — ABNORMAL LOW (ref 3.5–5.1)
Sodium: 138 mmol/L (ref 135–145)

## 2019-02-25 LAB — CBC
HCT: 44.1 % (ref 39.0–52.0)
Hemoglobin: 14.2 g/dL (ref 13.0–17.0)
MCH: 28.9 pg (ref 26.0–34.0)
MCHC: 32.2 g/dL (ref 30.0–36.0)
MCV: 89.6 fL (ref 80.0–100.0)
Platelets: 290 10*3/uL (ref 150–400)
RBC: 4.92 MIL/uL (ref 4.22–5.81)
RDW: 11.9 % (ref 11.5–15.5)
WBC: 10 10*3/uL (ref 4.0–10.5)
nRBC: 0 % (ref 0.0–0.2)

## 2019-02-25 LAB — TROPONIN I (HIGH SENSITIVITY)
Troponin I (High Sensitivity): 2 ng/L (ref <18)
Troponin I (High Sensitivity): 3 ng/L (ref <18)

## 2019-02-25 NOTE — ED Notes (Signed)
Pt called x3. No answer. Not seen in waiting room or outside

## 2019-02-25 NOTE — Patient Instructions (Signed)
Medication Instructions:  No Changes *If you need a refill on your cardiac medications before your next appointment, please call your pharmacy*  Lab Work: Your physician recommends that you return for lab work this week (TSH, ESR, CRP) If you have labs (blood work) drawn today and your tests are completely normal, you will receive your results only by: Marland Kitchen MyChart Message (if you have MyChart) OR . A paper copy in the mail If you have any lab test that is abnormal or we need to change your treatment, we will call you to review the results.  Testing/Procedures: Your physician has requested that you have an echocardiogram. Echocardiography is a painless test that uses sound waves to create images of your heart. It provides your doctor with information about the size and shape of your heart and how well your heart's chambers and valves are working. This procedure takes approximately one hour. There are no restrictions for this procedure. [place]  Follow-Up: At [facility], you and your health needs are our priority.  As part of our continuing mission to provide you with exceptional heart care, we have created designated Provider Care Teams.  These Care Teams include your primary Cardiologist (physician) and Advanced Practice Providers (APPs -  Physician Assistants and Nurse Practitioners) who all work together to provide you with the care you need, when you need it.  Other Instructions Follow up as needed

## 2019-03-11 ENCOUNTER — Other Ambulatory Visit (HOSPITAL_COMMUNITY): Payer: 59

## 2019-04-23 ENCOUNTER — Other Ambulatory Visit (HOSPITAL_COMMUNITY): Payer: 59

## 2019-04-24 ENCOUNTER — Telehealth (HOSPITAL_COMMUNITY): Payer: Self-pay | Admitting: Cardiology

## 2019-04-24 NOTE — Telephone Encounter (Signed)
Just an FYI. We have made several attempts to contact this patient including sending a letter to schedule or reschedule their echocardiogram. We will be removing the patient from the echo WQ.    04/23/19 pt NO SHOWED x 2 (staff message sent to Debra RN) [facility] 03/26/19 Attempted calling patient. Voicemail not set up. Could not leave message. 2.10.21 No show   Per Deliah Goody RN, remove from the WQ due to multiple attempts. 04/24/2019   Thank you

## 2020-03-09 ENCOUNTER — Encounter (HOSPITAL_COMMUNITY): Payer: Self-pay

## 2020-03-09 ENCOUNTER — Other Ambulatory Visit: Payer: Self-pay

## 2020-03-09 ENCOUNTER — Emergency Department (HOSPITAL_COMMUNITY): Payer: 59

## 2020-03-09 DIAGNOSIS — I214 Non-ST elevation (NSTEMI) myocardial infarction: Principal | ICD-10-CM | POA: Diagnosis present

## 2020-03-09 DIAGNOSIS — I301 Infective pericarditis: Secondary | ICD-10-CM | POA: Diagnosis present

## 2020-03-09 DIAGNOSIS — Z9119 Patient's noncompliance with other medical treatment and regimen: Secondary | ICD-10-CM

## 2020-03-09 DIAGNOSIS — Z8249 Family history of ischemic heart disease and other diseases of the circulatory system: Secondary | ICD-10-CM

## 2020-03-09 DIAGNOSIS — Z20822 Contact with and (suspected) exposure to covid-19: Secondary | ICD-10-CM | POA: Diagnosis present

## 2020-03-09 DIAGNOSIS — I5021 Acute systolic (congestive) heart failure: Secondary | ICD-10-CM | POA: Diagnosis present

## 2020-03-09 DIAGNOSIS — I110 Hypertensive heart disease with heart failure: Secondary | ICD-10-CM | POA: Diagnosis present

## 2020-03-09 DIAGNOSIS — E785 Hyperlipidemia, unspecified: Secondary | ICD-10-CM | POA: Diagnosis present

## 2020-03-09 DIAGNOSIS — F17210 Nicotine dependence, cigarettes, uncomplicated: Secondary | ICD-10-CM | POA: Diagnosis present

## 2020-03-09 DIAGNOSIS — E876 Hypokalemia: Secondary | ICD-10-CM | POA: Diagnosis present

## 2020-03-09 DIAGNOSIS — I4510 Unspecified right bundle-branch block: Secondary | ICD-10-CM | POA: Diagnosis present

## 2020-03-09 LAB — CBC
HCT: 44.2 % (ref 39.0–52.0)
Hemoglobin: 14.9 g/dL (ref 13.0–17.0)
MCH: 28.5 pg (ref 26.0–34.0)
MCHC: 33.7 g/dL (ref 30.0–36.0)
MCV: 84.5 fL (ref 80.0–100.0)
Platelets: 257 10*3/uL (ref 150–400)
RBC: 5.23 MIL/uL (ref 4.22–5.81)
RDW: 11.9 % (ref 11.5–15.5)
WBC: 8.4 10*3/uL (ref 4.0–10.5)
nRBC: 0 % (ref 0.0–0.2)

## 2020-03-09 NOTE — ED Triage Notes (Signed)
[facility] from home cc Chest pain x2 weeks hx of myocarditis  2ntg. 500 ns. 324 asprin  18 r ac

## 2020-03-10 ENCOUNTER — Inpatient Hospital Stay (HOSPITAL_COMMUNITY)
Admission: EM | Admit: 2020-03-10 | Discharge: 2020-03-11 | DRG: 280 | Disposition: A | Discharge status: Home / Self Care | Payer: 59 | Attending: Internal Medicine | Admitting: Internal Medicine

## 2020-03-10 ENCOUNTER — Other Ambulatory Visit: Payer: Self-pay

## 2020-03-10 ENCOUNTER — Encounter (HOSPITAL_COMMUNITY): Payer: Self-pay | Admitting: Internal Medicine

## 2020-03-10 ENCOUNTER — Encounter (HOSPITAL_COMMUNITY)
Admission: EM | Disposition: A | Discharge status: Home / Self Care | Payer: Self-pay | Source: Home / Self Care | Attending: Internal Medicine

## 2020-03-10 ENCOUNTER — Emergency Department (HOSPITAL_COMMUNITY): Payer: 59

## 2020-03-10 DIAGNOSIS — I4510 Unspecified right bundle-branch block: Secondary | ICD-10-CM | POA: Diagnosis present

## 2020-03-10 DIAGNOSIS — Z20822 Contact with and (suspected) exposure to covid-19: Secondary | ICD-10-CM | POA: Diagnosis present

## 2020-03-10 DIAGNOSIS — I110 Hypertensive heart disease with heart failure: Secondary | ICD-10-CM | POA: Diagnosis present

## 2020-03-10 DIAGNOSIS — I214 Non-ST elevation (NSTEMI) myocardial infarction: Principal | ICD-10-CM | POA: Diagnosis present

## 2020-03-10 DIAGNOSIS — Z9119 Patient's noncompliance with other medical treatment and regimen: Secondary | ICD-10-CM | POA: Diagnosis not present

## 2020-03-10 DIAGNOSIS — I301 Infective pericarditis: Secondary | ICD-10-CM | POA: Diagnosis present

## 2020-03-10 DIAGNOSIS — R7989 Other specified abnormal findings of blood chemistry: Secondary | ICD-10-CM | POA: Diagnosis present

## 2020-03-10 DIAGNOSIS — F17200 Nicotine dependence, unspecified, uncomplicated: Secondary | ICD-10-CM

## 2020-03-10 DIAGNOSIS — E876 Hypokalemia: Secondary | ICD-10-CM | POA: Diagnosis present

## 2020-03-10 DIAGNOSIS — E785 Hyperlipidemia, unspecified: Secondary | ICD-10-CM | POA: Diagnosis present

## 2020-03-10 DIAGNOSIS — R079 Chest pain, unspecified: Secondary | ICD-10-CM | POA: Diagnosis present

## 2020-03-10 DIAGNOSIS — R778 Other specified abnormalities of plasma proteins: Secondary | ICD-10-CM | POA: Diagnosis present

## 2020-03-10 DIAGNOSIS — Z8249 Family history of ischemic heart disease and other diseases of the circulatory system: Secondary | ICD-10-CM | POA: Diagnosis not present

## 2020-03-10 DIAGNOSIS — Z720 Tobacco use: Secondary | ICD-10-CM | POA: Diagnosis not present

## 2020-03-10 DIAGNOSIS — I5021 Acute systolic (congestive) heart failure: Secondary | ICD-10-CM

## 2020-03-10 DIAGNOSIS — F17210 Nicotine dependence, cigarettes, uncomplicated: Secondary | ICD-10-CM | POA: Diagnosis present

## 2020-03-10 DIAGNOSIS — I514 Myocarditis, unspecified: Secondary | ICD-10-CM | POA: Diagnosis present

## 2020-03-10 HISTORY — PX: LEFT HEART CATH AND CORONARY ANGIOGRAPHY: CATH118249

## 2020-03-10 LAB — BASIC METABOLIC PANEL
Anion gap: 9 (ref 5–15)
BUN: 13 mg/dL (ref 6–20)
CO2: 22 mmol/L (ref 22–32)
Calcium: 8.8 mg/dL — ABNORMAL LOW (ref 8.9–10.3)
Chloride: 102 mmol/L (ref 98–111)
Creatinine, Ser: 0.89 mg/dL (ref 0.61–1.24)
GFR, Estimated: >60 mL/min (ref >60)
Glucose, Bld: 120 mg/dL — ABNORMAL HIGH (ref 70–99)
Potassium: 3.2 mmol/L — ABNORMAL LOW (ref 3.5–5.1)
Sodium: 133 mmol/L — ABNORMAL LOW (ref 135–145)

## 2020-03-10 LAB — RESP PANEL BY RT-PCR (FLU A&B, COVID) ARPGX2
Influenza A by PCR: NEGATIVE
Influenza B by PCR: NEGATIVE
SARS Coronavirus 2 by RT PCR: NEGATIVE

## 2020-03-10 LAB — TROPONIN I (HIGH SENSITIVITY)
Troponin I (High Sensitivity): 258 ng/L (ref <18)
Troponin I (High Sensitivity): 2748 ng/L (ref <18)
Troponin I (High Sensitivity): 3475 ng/L (ref <18)
Troponin I (High Sensitivity): 768 ng/L (ref <18)

## 2020-03-10 LAB — HEPARIN LEVEL (UNFRACTIONATED): Heparin Unfractionated: 0.12 IU/mL — ABNORMAL LOW (ref 0.30–0.70)

## 2020-03-10 SURGERY — LEFT HEART CATH AND CORONARY ANGIOGRAPHY
Anesthesia: LOCAL

## 2020-03-10 MED ORDER — LABETALOL HCL 5 MG/ML IV SOLN
10.0000 mg | INTRAVENOUS | Status: AC | PRN
Start: 2020-03-10 — End: 2020-03-10

## 2020-03-10 MED ORDER — SODIUM CHLORIDE 0.9 % WEIGHT BASED INFUSION
1.0000 mL/kg/h | INTRAVENOUS | Status: DC
  Administered 2020-03-10: 1 mL/kg/h via INTRAVENOUS

## 2020-03-10 MED ORDER — ACETAMINOPHEN 325 MG PO TABS
650.0000 mg | ORAL_TABLET | ORAL | Status: DC | PRN
  Administered 2020-03-10: 650 mg via ORAL
  Filled 2020-03-10: qty 2

## 2020-03-10 MED ORDER — MIDAZOLAM HCL 2 MG/2ML IJ SOLN
INTRAMUSCULAR | Status: AC
  Filled 2020-03-10: qty 2

## 2020-03-10 MED ORDER — HEPARIN BOLUS VIA INFUSION
4000.0000 [IU] | Freq: Once | INTRAVENOUS | Status: AC
  Administered 2020-03-10: 4000 [IU] via INTRAVENOUS

## 2020-03-10 MED ORDER — HEPARIN SODIUM (PORCINE) 1000 UNIT/ML IJ SOLN
INTRAMUSCULAR | Status: DC | PRN
  Administered 2020-03-10: 6000 [IU] via INTRAVENOUS

## 2020-03-10 MED ORDER — HEPARIN (PORCINE) IN NACL 1000-0.9 UT/500ML-% IV SOLN
INTRAVENOUS | Status: DC | PRN
  Administered 2020-03-10 (×2): 500 mL

## 2020-03-10 MED ORDER — SODIUM CHLORIDE 0.9 % IV SOLN: 250.0000 mL | INTRAVENOUS | Status: DC | PRN

## 2020-03-10 MED ORDER — ATORVASTATIN CALCIUM 80 MG PO TABS
80.0000 mg | ORAL_TABLET | Freq: Every day | ORAL | Status: DC
Start: 2020-03-10 — End: 2020-03-11
  Administered 2020-03-10 – 2020-03-11 (×2): 80 mg via ORAL
  Filled 2020-03-10 (×2): qty 1

## 2020-03-10 MED ORDER — MIDAZOLAM HCL 2 MG/2ML IJ SOLN
INTRAMUSCULAR | Status: DC | PRN
  Administered 2020-03-10: 1 mg via INTRAVENOUS

## 2020-03-10 MED ORDER — OXYCODONE HCL 5 MG PO TABS: 5.0000 mg | ORAL_TABLET | ORAL | Status: DC | PRN

## 2020-03-10 MED ORDER — IOHEXOL 350 MG/ML SOLN
100.0000 mL | Freq: Once | INTRAVENOUS | Status: AC | PRN
  Administered 2020-03-10: 100 mL via INTRAVENOUS

## 2020-03-10 MED ORDER — VERAPAMIL HCL 2.5 MG/ML IV SOLN
INTRAVENOUS | Status: AC
  Filled 2020-03-10: qty 2

## 2020-03-10 MED ORDER — ASPIRIN 81 MG PO CHEW
324.0000 mg | CHEWABLE_TABLET | Freq: Once | ORAL | Status: DC
  Filled 2020-03-10: qty 4

## 2020-03-10 MED ORDER — FENTANYL CITRATE (PF) 100 MCG/2ML IJ SOLN
100.0000 ug | Freq: Once | INTRAMUSCULAR | Status: AC
  Administered 2020-03-10: 100 ug via INTRAVENOUS
  Filled 2020-03-10: qty 2

## 2020-03-10 MED ORDER — VERAPAMIL HCL 2.5 MG/ML IV SOLN
INTRAVENOUS | Status: DC | PRN
  Administered 2020-03-10: 10 mL via INTRA_ARTERIAL

## 2020-03-10 MED ORDER — LORAZEPAM 2 MG/ML IJ SOLN: 1.0000 mg | INTRAMUSCULAR | Status: DC | PRN

## 2020-03-10 MED ORDER — LIDOCAINE HCL (PF) 1 % IJ SOLN
INTRAMUSCULAR | Status: AC
  Filled 2020-03-10: qty 30

## 2020-03-10 MED ORDER — SODIUM CHLORIDE 0.9% FLUSH: 3.0000 mL | INTRAVENOUS | Status: DC | PRN

## 2020-03-10 MED ORDER — SODIUM CHLORIDE 0.9 % IV SOLN: INTRAVENOUS | Status: AC

## 2020-03-10 MED ORDER — IOHEXOL 350 MG/ML SOLN
INTRAVENOUS | Status: DC | PRN
  Administered 2020-03-10: 65 mL

## 2020-03-10 MED ORDER — COLCHICINE 0.6 MG PO TABS
0.6000 mg | ORAL_TABLET | Freq: Two times a day (BID) | ORAL | Status: DC
  Administered 2020-03-10 – 2020-03-11 (×2): 0.6 mg via ORAL
  Filled 2020-03-10 (×2): qty 1

## 2020-03-10 MED ORDER — HEPARIN SODIUM (PORCINE) 1000 UNIT/ML IJ SOLN
INTRAMUSCULAR | Status: AC
  Filled 2020-03-10: qty 1

## 2020-03-10 MED ORDER — HEPARIN BOLUS VIA INFUSION
2000.0000 [IU] | Freq: Once | INTRAVENOUS | Status: AC
  Administered 2020-03-10: 2000 [IU] via INTRAVENOUS
  Filled 2020-03-10: qty 2000

## 2020-03-10 MED ORDER — HYDROMORPHONE HCL 1 MG/ML IJ SOLN
1.0000 mg | Freq: Once | INTRAMUSCULAR | Status: AC
  Administered 2020-03-10: 1 mg via INTRAVENOUS
  Filled 2020-03-10: qty 1

## 2020-03-10 MED ORDER — SODIUM CHLORIDE 0.9% FLUSH
3.0000 mL | Freq: Two times a day (BID) | INTRAVENOUS | Status: DC
  Administered 2020-03-10 – 2020-03-11 (×2): 3 mL via INTRAVENOUS

## 2020-03-10 MED ORDER — SODIUM CHLORIDE 0.9% FLUSH
3.0000 mL | Freq: Two times a day (BID) | INTRAVENOUS | Status: DC
  Administered 2020-03-10: 3 mL via INTRAVENOUS

## 2020-03-10 MED ORDER — ASPIRIN 81 MG PO CHEW
81.0000 mg | CHEWABLE_TABLET | ORAL | Status: AC
  Administered 2020-03-10: 81 mg via ORAL

## 2020-03-10 MED ORDER — SODIUM CHLORIDE 0.9 % WEIGHT BASED INFUSION
3.0000 mL/kg/h | INTRAVENOUS | Status: DC
  Administered 2020-03-10: 3 mL/kg/h via INTRAVENOUS

## 2020-03-10 MED ORDER — NICOTINE 21 MG/24HR TD PT24
21.0000 mg | MEDICATED_PATCH | Freq: Every day | TRANSDERMAL | Status: DC
  Administered 2020-03-10 – 2020-03-11 (×3): 21 mg via TRANSDERMAL
  Filled 2020-03-10 (×3): qty 1

## 2020-03-10 MED ORDER — POTASSIUM CHLORIDE CRYS ER 20 MEQ PO TBCR
40.0000 meq | EXTENDED_RELEASE_TABLET | ORAL | Status: AC
  Administered 2020-03-10 (×2): 40 meq via ORAL
  Filled 2020-03-10 (×2): qty 2

## 2020-03-10 MED ORDER — ONDANSETRON HCL 4 MG/2ML IJ SOLN: 4.0000 mg | Freq: Four times a day (QID) | INTRAMUSCULAR | Status: DC | PRN

## 2020-03-10 MED ORDER — ASPIRIN 81 MG PO CHEW
81.0000 mg | CHEWABLE_TABLET | ORAL | Status: DC
  Filled 2020-03-10: qty 1

## 2020-03-10 MED ORDER — METOPROLOL TARTRATE 12.5 MG HALF TABLET
12.5000 mg | ORAL_TABLET | Freq: Two times a day (BID) | ORAL | Status: DC
  Administered 2020-03-10 – 2020-03-11 (×2): 12.5 mg via ORAL
  Filled 2020-03-10 (×3): qty 1

## 2020-03-10 MED ORDER — HEPARIN (PORCINE) 25000 UT/250ML-% IV SOLN: 1850.0000 [IU]/h | INTRAVENOUS | Status: DC

## 2020-03-10 MED ORDER — HYDRALAZINE HCL 20 MG/ML IJ SOLN: 10.0000 mg | INTRAMUSCULAR | Status: AC | PRN

## 2020-03-10 MED ORDER — NITROGLYCERIN 0.4 MG SL SUBL
0.4000 mg | SUBLINGUAL_TABLET | SUBLINGUAL | Status: AC | PRN
  Administered 2020-03-10 (×3): 0.4 mg via SUBLINGUAL
  Filled 2020-03-10 (×3): qty 1

## 2020-03-10 MED ORDER — FENTANYL CITRATE (PF) 100 MCG/2ML IJ SOLN
INTRAMUSCULAR | Status: AC
  Filled 2020-03-10: qty 2

## 2020-03-10 MED ORDER — FENTANYL CITRATE (PF) 100 MCG/2ML IJ SOLN
INTRAMUSCULAR | Status: DC | PRN
  Administered 2020-03-10: 50 ug via INTRAVENOUS

## 2020-03-10 MED ORDER — HEPARIN (PORCINE) IN NACL 1000-0.9 UT/500ML-% IV SOLN
INTRAVENOUS | Status: AC
  Filled 2020-03-10: qty 1000

## 2020-03-10 MED ORDER — LIDOCAINE HCL (PF) 1 % IJ SOLN
INTRAMUSCULAR | Status: DC | PRN
  Administered 2020-03-10: 5 mL

## 2020-03-10 MED ORDER — HEPARIN (PORCINE) 25000 UT/250ML-% IV SOLN
1900.0000 [IU]/h | INTRAVENOUS | Status: DC
  Administered 2020-03-10: 1600 [IU]/h via INTRAVENOUS
  Filled 2020-03-10 (×2): qty 250

## 2020-03-10 SURGICAL SUPPLY — 14 items
BAG SNAP BAND KOVER 36X36 (MISCELLANEOUS) ×2 IMPLANT
CATH 5FR JL3.5 JR4 ANG PIG MP (CATHETERS) ×2 IMPLANT
COVER DOME SNAP 22 D (MISCELLANEOUS) ×2 IMPLANT
DEVICE RAD COMP TR BAND LRG (VASCULAR PRODUCTS) ×2 IMPLANT
ELECT DEFIB PAD ADLT CADENCE (PAD) ×2 IMPLANT
GLIDESHEATH SLEND A-KIT 6F 22G (SHEATH) ×2 IMPLANT
GUIDEWIRE INQWIRE 1.5J.035X260 (WIRE) ×1 IMPLANT
INQWIRE 1.5J .035X260CM (WIRE) ×2
KIT HEART LEFT (KITS) ×2 IMPLANT
MAT PREVALON FULL STRYKER (MISCELLANEOUS) ×2 IMPLANT
PACK CARDIAC CATHETERIZATION (CUSTOM PROCEDURE TRAY) ×2 IMPLANT
SHEATH PROBE COVER 6X72 (BAG) ×2 IMPLANT
TRANSDUCER W/STOPCOCK (MISCELLANEOUS) ×2 IMPLANT
TUBING CIL FLEX 10 FLL-RA (TUBING) ×2 IMPLANT

## 2020-03-10 NOTE — H&P (Addendum)
Cardiology Admission History and Physical:   Patient ID: Evan Conner MRN: [id number]; DOB: [birth year]   Admission date: 03/10/2020  Primary Care Provider: Kari Baars, MD [facility] Cardiologist: Rollene Rotunda, MD  [facility] Electrophysiologist:  None   Chief Complaint:  Chest pain  Patient Profile:   Evan Conner is a 25 y.o. male with PMH of ADHD, tobacco use and myocarditis who presented with chest pain and found to have an NSTEMI.  History of Present Illness:   Evan Conner is a 25 yo male with PMH noted above. He was seen back in 2018 with chest pain. He was dx with myocarditis and treated with symptoms resolved. Last seen in follow up in the office on 01/2019 with Dr. Antoine Poche. Notes indicate he had been seen in the ED several times with chest pain over the past 2 years with negative work up. He did report ongoing chest pain at this visit, which was felt to be more GI related.   Since that time he has been in his usual state of health until about 2 weeks ago. Developed episodes of sharp chest pain, but also dull, aching at times. Does not seem exertional in nature, but he has also noted being more fatigued. Has noted episodes while at work as well. Last night reports onset of sharp pain which was significantly worse than prior episodes, along with nausea and syncope. States he woke up and EMS was there. He was brought to [facility] and given 2 SL nitro while en route which helped with the sharp chest pain, but dull heaviness was still present.   In the ED his labs showed Na+ 133, K+ 3.2, Cr 0.89, hsTn 258>>768>>2748>>3475. EKG showed SR with IVCD, TWI in lead III, nonspecific ST changes possibly early repol. He was started on IV heparin. CT angio was negative for PE, did not note any coronary calcifications. Case discussed with overnight cardiology fellow and transferred to [facility] for further management. He was pain free at the time of arrival.    Of note, does have strong  family hx with father having multiple stents, first placed in his 47s. Grandfather had CABG at 20. Mother with CHF.   Past Medical History:  Diagnosis Date  . ADHD (attention deficit hyperactivity disorder)   . Hypertension   . Myocarditis [facility])     Past Surgical History:  Procedure Laterality Date  . TONSILLECTOMY    . WRIST SURGERY Right      Medications Prior to Admission: Prior to Admission medications   Medication Sig Start Date End Date Taking? Authorizing Provider  aspirin 81 MG chewable tablet Chew 81 mg by mouth daily.    [provider]     Allergies:    Allergies  Allergen Reactions  . Coconut Oil Swelling    Throat swells    Social History:   Social History   Socioeconomic History  . Marital status: Single    Spouse name: Not on file  . Number of children: Not on file  . Years of education: Not on file  . Highest education level: Not on file  Occupational History  . Not on file  Tobacco Use  . Smoking status: Current Every Day Smoker    Packs/day: 1.50  . Smokeless tobacco: Former Clinical biochemist  . Vaping Use: Former  Substance and Sexual Activity  . Alcohol use: No  . Drug use: Yes    Types: Marijuana  . Sexual activity: Not on file  Other Topics  Concern  . Not on file  Social History Narrative  . Not on file   Social Determinants of Health   Financial Resource Strain: Not on file  Food Insecurity: Not on file  Transportation Needs: Not on file  Physical Activity: Not on file  Stress: Not on file  Social Connections: Not on file  Intimate Partner Violence: Not on file    Family History:   The patient's family history includes CAD in his cousin and father.    ROS:  Please see the history of present illness.  All other ROS reviewed and negative.     Physical Exam/Data:   Vitals:   03/10/20 0243 03/10/20 0300 03/10/20 0600 03/10/20 0724  BP:  (!) 118/92 128/69   Pulse:  74    Resp:  (!) 21 16   Temp: 97.8 F (36.6 C)    99.3 F (37.4 C)  TempSrc: Oral   Oral  SpO2:  99%    Weight:      Height:        Intake/Output Summary (Last 24 hours) at 03/10/2020 0902 Last data filed at 03/09/2020 2304 Gross per 24 hour  Intake 500 ml  Output -  Net 500 ml   Last 3 Weights 03/09/2020 02/25/2019 10/15/2017  Weight (lbs) 295 lb 10.2 oz 295 lb 9.6 oz 275 lb  Weight (kg) 134.1 kg 134.083 kg 124.739 kg  Some encounter information is confidential and restricted. Go to Review Flowsheets activity to see all data.     Body mass index is 40.1 kg/m.  General:  Well nourished, well developed, in no acute distress HEENT: normal Lymph: no adenopathy Neck: no JVD Endocrine:  No thryomegaly Vascular: No carotid bruits; FA pulses 2+ bilaterally without bruits  Cardiac:  normal S1, S2; RRR; no murmur  Lungs:  clear to auscultation bilaterally, no wheezing, rhonchi or rales  Abd: soft, nontender, no hepatomegaly  Ext: no edema Musculoskeletal:  No deformities, BUE and BLE strength normal and equal Skin: warm and dry  Neuro:  CNs 2-12 intact, no focal abnormalities noted Psych:  Normal affect    EKG:  The ECG that was done 03/10/20 was personally reviewed and demonstrates SR with IVCD, TWI in lead, nonspecific ST changes  Relevant CV Studies:  N/a   Laboratory Data:  High Sensitivity Troponin:   Recent Labs  Lab 03/09/20 2331 03/10/20 0203 03/10/20 0446 03/10/20 0633  TROPONINIHS 258* 768* 2,748* 3,475*      Chemistry Recent Labs  Lab 03/09/20 2331  NA 133*  K 3.2*  CL 102  CO2 22  GLUCOSE 120*  BUN 13  CREATININE 0.89  CALCIUM 8.8*  GFRNONAA >60  ANIONGAP 9    No results for input(s): PROT, ALBUMIN, AST, ALT, ALKPHOS, BILITOT in the last 168 hours. Hematology Recent Labs  Lab 03/09/20 2331  WBC 8.4  RBC 5.23  HGB 14.9  HCT 44.2  MCV 84.5  MCH 28.5  MCHC 33.7  RDW 11.9  PLT 257   BNPNo results for input(s): BNP, PROBNP in the last 168 hours.  DDimer No results for input(s): DDIMER in  the last 168 hours.   Radiology/Studies:  DG Chest 2 View  Result Date: 03/09/2020 CLINICAL DATA:  Chest pain x2 weeks EXAM: CHEST - 2 VIEW COMPARISON:  Chest radiograph February 24, 2019. FINDINGS: The heart size and mediastinal contours are within normal limits. Similar mild peribronchial thickening. No focal consolidation. No pleural effusion. No pneumothorax. The visualized skeletal structures are unremarkable. IMPRESSION:  Similar mild bronchitic changes, no new focal consolidation. Electronically Signed   By: Maudry Mayhew MD   On: 03/09/2020 23:34   CT Angio Chest PE W and/or Wo Contrast  Result Date: 03/10/2020 CLINICAL DATA:  Chest pain EXAM: CT ANGIOGRAPHY CHEST WITH CONTRAST TECHNIQUE: Multidetector CT imaging of the chest was performed using the standard protocol during bolus administration of intravenous contrast. Multiplanar CT image reconstructions and MIPs were obtained to evaluate the vascular anatomy. CONTRAST:  OMNIPAQUE IOHEXOL 350 MG/ML SOLN COMPARISON:  08/31/2016 FINDINGS: Cardiovascular: No filling defects in the pulmonary arteries to suggest pulmonary emboli. Heart is normal size. Aorta is normal caliber. Mediastinum/Nodes: No mediastinal, hilar, or axillary adenopathy. Trachea and esophagus are unremarkable. Thyroid unremarkable. Lungs/Pleura: Lungs are clear. No focal airspace opacities or suspicious nodules. No effusions. Upper Abdomen: Imaging into the upper abdomen demonstrates no acute findings. Musculoskeletal: Chest wall soft tissues are unremarkable. No acute bony abnormality. Review of the MIP images confirms the above findings. IMPRESSION: No evidence of pulmonary embolus. No acute cardiopulmonary disease. Electronically Signed   By: Charlett Nose M.D.   On: 03/10/2020 03:43     Assessment and Plan:   Evan Conner is a 25 y.o. male with PMH of ADHD, tobacco use and myocarditis who presented with chest pain and found to have an NSTEMI.  1. NSTEMI: ddx  includes ACS vs myocarditis. Given age, suspect more likely myocarditis, but he does have a significant family history of CAD: father has had 5 coronary stents, first in his 74s.  Currently pain free at this time. Will continue on IV heparin. Seems best option is for cardiac cath to rule out obstructive coronary disease. He is agreeable to this.  If unremarkable, will plan Cardiac MRI.  -- The patient understands that risks included but are not limited to stroke (1 in 1000), death (1 in 1000), kidney failure [usually temporary] (1 in 500), bleeding (1 in 200), allergic reaction [possibly serious] (1 in 200).  -- ASA, add statin and low dose metoprolol -- check echo  2. Tobacco use: cessation advised  3. Hypokalemia: suppl   Risk Assessment/Risk Scores:      :[id number]}   TIMI Risk Score for Unstable Angina or Non-ST Elevation MI:   The patient's TIMI risk score is 3, which indicates a 13% risk of all cause mortality, new or recurrent myocardial infarction or need for urgent revascularization in the next 14 days.{  Severity of Illness: The appropriate patient status for this patient is OBSERVATION. Observation status is judged to be reasonable and necessary in order to provide the required intensity of service to ensure the patient's safety. The patient's presenting symptoms, physical exam findings, and initial radiographic and laboratory data in the context of their medical condition is felt to place them at decreased risk for further clinical deterioration. Furthermore, it is anticipated that the patient will be medically stable for discharge from the hospital within 2 midnights of admission. The following factors support the patient status of observation.   " The patient's presenting symptoms include chest pain, nausea, syncope. " The physical exam findings include no acute findings. " The initial radiographic and laboratory data are elevated troponin, abnormal EKG.  For questions or  updates, please contact [facility] Please consult [email or web address] for contact info under     Signed, Laverda Page, NP  03/10/2020 9:02 AM   Patient seen and examined.  Agree with above medical history.  Evan Conner is a 25 year old male with history of tobacco  abuse who presents with NSTEMI.  He reports he has been having intermittent chest pain for the last 2 weeks.  Describes pressure in the center of his chest.  Does not seem to relate related to exertion, but he reports minimal exertion.  Last night developed acute onset of severe chest pain along with near syncopal episode.  He went to the [facility]  ED last night.  Initial vital signs were BP 121/87, pulse 89, SPO2 98% on room air.  Labs showed creatinine 0.9, hemoglobin 14.9, platelets 257, troponin 258 >758 > 2748 > 3475.  EKG showed normal sinus rhythm, rate 75, incomplete right bundle branch block.  CTPA showed no PE.  He is currently chest pain-free.  On exam, patient is alert and oriented, regular rate and rhythm, no murmurs, lungs CTAB, no LE edema or JVD.  For his acute chest pain and troponin elevation, differential diagnosis includes acute coronary syndrome versus acute myocarditis.  Given his age, would suspect more likely myocarditis.  However he has a significant family history of CAD, and reports his father received his first coronary stent in his 64s.  Recommend left heart catheterization to rule out CAD.  If unremarkable, will plan cardiac MRI to evaluate for myocarditis.  Risks and benefits of cardiac catheterization have been discussed with the patient.  These include bleeding, infection, kidney damage, stroke, heart attack, death.  The patient understands these risks and is willing to proceed.   Little Ishikawa, MD

## 2020-03-10 NOTE — Progress Notes (Signed)
ANTICOAGULATION CONSULT NOTE  Pharmacy Consult for Heparin Indication: chest pain/ACS  Allergies  Allergen Reactions  . Coconut Oil Swelling    Throat swells    Patient Measurements: Height: 6' (182.9 cm) Weight: 134.1 kg (295 lb 10.2 oz) IBW/kg (Calculated) : 77.6 Heparin Dosing Weight: 110 kg  Vital Signs: Temp: 98.8 F (37.1 C) (02/10 1220) Temp Source: Oral (02/10 1220) BP: 110/67 (02/10 1220) Pulse Rate: 79 (02/10 1220)  Labs: Recent Labs    03/09/20 2331 03/10/20 0203 03/10/20 0446 03/10/20 0633 03/10/20 1157  HGB 14.9  --   --   --   --   HCT 44.2  --   --   --   --   PLT 257  --   --   --   --   HEPARINUNFRC  --   --   --   --  0.12*  CREATININE 0.89  --   --   --   --   TROPONINIHS 258* 768* 2,748* 3,475*  --     Estimated Creatinine Clearance: 179.8 mL/min (by C-G formula based on SCr of 0.89 mg/dL).   Medical History: Past Medical History:  Diagnosis Date  . ADHD (attention deficit hyperactivity disorder)   . Hypertension   . Myocarditis (HCC)     Medications:  No current facility-administered medications on file prior to encounter.   Current Outpatient Medications on File Prior to Encounter  Medication Sig Dispense Refill  . aspirin 81 MG chewable tablet Chew 81 mg by mouth once.    . calcium carbonate (TUMS EX) 750 MG chewable tablet Chew 1-2 tablets by mouth as needed for heartburn.       Assessment: 25 y.o. male with chest pain and elevated troponin on heparin. Plans noted for cath today  -heparin level= 0.21  Goal of Therapy:  Heparin level 0.3-0.7 units/ml Monitor platelets by anticoagulation protocol: Yes   Plan:  -heparin 2000 units x1 and increase infusion to 1900 units/hr -Will follow plans post cath  Harland German, PharmD Clinical Pharmacist **Pharmacist phone directory can now be found on [email or web address] ([email or web address]).  Listed under [facility].

## 2020-03-10 NOTE — Progress Notes (Signed)
ANTICOAGULATION CONSULT NOTE - Initial Consult  Pharmacy Consult for Heparin Indication: chest pain/ACS  Allergies  Allergen Reactions  . Coconut Oil Swelling    Throat swells    Patient Measurements: Height: 6' (182.9 cm) Weight: 134.1 kg (295 lb 10.2 oz) IBW/kg (Calculated) : 77.6 Heparin Dosing Weight: 110 kg  Vital Signs: Temp: 97.8 F (36.6 C) (02/10 0243) Temp Source: Oral (02/10 0243) BP: 118/92 (02/10 0300) Pulse Rate: 74 (02/10 0300)  Labs: Recent Labs    03/09/20 2331 03/10/20 0203  HGB 14.9  --   HCT 44.2  --   PLT 257  --   CREATININE 0.89  --   TROPONINIHS 258* 768*    Estimated Creatinine Clearance: 179.8 mL/min (by C-G formula based on SCr of 0.89 mg/dL).   Medical History: Past Medical History:  Diagnosis Date  . ADHD (attention deficit hyperactivity disorder)   . Hypertension   . Myocarditis (HCC)     Medications:  No current facility-administered medications on file prior to encounter.   Current Outpatient Medications on File Prior to Encounter  Medication Sig Dispense Refill  . aspirin 81 MG chewable tablet Chew 81 mg by mouth daily.       Assessment: 25 y.o. male with chest pain and elevated troponin for heparin   Goal of Therapy:  Heparin level 0.3-0.7 units/ml Monitor platelets by anticoagulation protocol: Yes   Plan:  Heparin 4000 units IV bolus, then start heparin 1600 units/hr Check heparin level in 6 hours.   Eddie Candle 03/10/2020,3:22 AM

## 2020-03-10 NOTE — ED Notes (Signed)
Report to [facility] given 

## 2020-03-10 NOTE — ED Notes (Signed)
Date and time results received: 03/10/20 0027 (use smartphrase ".now" to insert current time)  Test: troponin Critical Value: 258  Name of Provider Notified: dr Clayborne Dana  Orders Received? Or Actions Taken?: Actions Taken: no orders received

## 2020-03-10 NOTE — ED Notes (Signed)
Date and time results received: 03/10/20 0314 (use smartphrase ".now" to insert current time)  Test: troponin Critical Value: 768  Name of Provider Notified: Dr Clayborne Dana  Orders Received? Or Actions Taken?: Actions Taken: no orders received

## 2020-03-10 NOTE — ED Provider Notes (Signed)
Emergency Department Provider Note  I have reviewed the triage vital signs and the nursing notes.  HISTORY  Chief Complaint Chest Pain   HPI Evan Conner is a 25 y.o. male with history of hypertension who presents the emerge department today secondary to chest pain.  Of note patient had an episode a couple years ago was diagnosed myocarditis after a flulike illness.  However he does have a history in the family of significant coronary artery disease at a young age and to include his dad who had his first stent at the age of 53.  His grandfather had multiple stents and finally got CABG in the age of 67.  The patient states he has had about 2 weeks worth of intermittent chest pain.   States he has had some fatigue at work during that time as well but no really exertional related symptoms.  No fevers.  Did have some myalgias once or twice but nothing persistent.  However tonight the patient had a onset of severe chest pain associated with nausea and syncope.  He states he woke up and EMS was there.  Was given some nitroglycerin and had already taken aspirin.  Brought here for further evaluation.  Patient is chest pain-free at this time.  He is a smoker.  He does not have any known coronary disease himself or hyperlipidemia or diabetes.  No other associated or modifying symptoms.    Past Medical History:  Diagnosis Date  . ADHD (attention deficit hyperactivity disorder)   . Hypertension   . Myocarditis [facility])     Patient Active Problem List   Diagnosis Date Noted  . NSTEMI (non-ST elevated myocardial infarction) (HCC) 03/10/2020  . Myocarditis (HCC) 02/11/2017  . Syncope 02/11/2017  . Adjustment disorder with disturbance of emotion 12/21/2016  . Chest pain 08/31/2016  . Elevated troponin 08/31/2016  . Fever   . Acute viral pericarditis     Past Surgical History:  Procedure Laterality Date  . TONSILLECTOMY    . WRIST SURGERY Right     Current Outpatient Rx  . Order #:  [id number] Class: Historical Med    Allergies Coconut oil  Family History  Problem Relation Age of Onset  . CAD Father        1st MI at age 88  . CAD Cousin     Social History Social History   Tobacco Use  . Smoking status: Current Every Day Smoker    Packs/day: 1.50  . Smokeless tobacco: Former Clinical biochemist  . Vaping Use: Former  Substance Use Topics  . Alcohol use: No  . Drug use: Yes    Types: Marijuana    Review of Systems  All other systems negative except as documented in the HPI. All pertinent positives and negatives as reviewed in the HPI. ____________________________________________  PHYSICAL EXAM:  VITAL SIGNS: ED Triage Vitals  Enc Vitals Group     BP 03/09/20 2302 121/87     Pulse Rate 03/09/20 2302 89     Resp 03/09/20 2302 17     Temp 03/10/20 0220 98.1 F (36.7 C)     Temp Source 03/10/20 0220 Oral     SpO2 03/09/20 2302 98 %     Weight 03/09/20 2259 295 lb 10.2 oz (134.1 kg)     Height 03/09/20 2259 6' (1.829 m)    Constitutional: Alert and oriented. Well appearing and in no acute distress. Eyes: Conjunctivae are normal. PERRL. EOMI. Head: Atraumatic. Nose: No congestion/rhinnorhea. Mouth/Throat: Mucous  membranes are moist.  Oropharynx non-erythematous. Neck: No stridor.  No meningeal signs.   Cardiovascular: Normal rate, regular rhythm. Good peripheral circulation. Grossly normal heart sounds.   Respiratory: Normal respiratory effort.  No retractions. Lungs CTAB. Gastrointestinal: Soft and nontender. No distention.  Musculoskeletal: No lower extremity tenderness nor edema. No gross deformities of extremities. Neurologic:  Normal speech and language. No gross focal neurologic deficits are appreciated.  Skin:  Skin is warm, dry and intact. No rash noted.  ____________________________________________   LABS (all labs ordered are listed, but only abnormal results are displayed)  Labs Reviewed  BASIC METABOLIC PANEL - Abnormal; Notable  for the following components:      Result Value   Sodium 133 (*)    Potassium 3.2 (*)    Glucose, Bld 120 (*)    Calcium 8.8 (*)    All other components within normal limits  TROPONIN I (HIGH SENSITIVITY) - Abnormal; Notable for the following components:   Troponin I (High Sensitivity) 258 (*)    All other components within normal limits  TROPONIN I (HIGH SENSITIVITY) - Abnormal; Notable for the following components:   Troponin I (High Sensitivity) 768 (*)    All other components within normal limits  RESP PANEL BY RT-PCR (FLU A&B, COVID) ARPGX2  CBC  HEPARIN LEVEL (UNFRACTIONATED)  TROPONIN I (HIGH SENSITIVITY)   ____________________________________________  EKG   EKG Interpretation  Date/Time:  Thursday March 10 2020 02:18:22 EST Ventricular Rate:  75 PR Interval:  152 QRS Duration: 114 QT Interval:  388 QTC Calculation: 434 R Axis:   78 Text Interpretation: Sinus rhythm Incomplete right bundle branch block very siilar to most recent approxcimately 3 hours ago which was also not much different than january 2021 Confirmed by Marily Memos [phone]) on 03/10/2020 2:22:03 AM       ____________________________________________  RADIOLOGY  DG Chest 2 View  Result Date: 03/09/2020 CLINICAL DATA:  Chest pain x2 weeks EXAM: CHEST - 2 VIEW COMPARISON:  Chest radiograph February 24, 2019. FINDINGS: The heart size and mediastinal contours are within normal limits. Similar mild peribronchial thickening. No focal consolidation. No pleural effusion. No pneumothorax. The visualized skeletal structures are unremarkable. IMPRESSION: Similar mild bronchitic changes, no new focal consolidation. Electronically Signed   By: Maudry Mayhew MD   On: 03/09/2020 23:34   CT Angio Chest PE W and/or Wo Contrast  Result Date: 03/10/2020 CLINICAL DATA:  Chest pain EXAM: CT ANGIOGRAPHY CHEST WITH CONTRAST TECHNIQUE: Multidetector CT imaging of the chest was performed using the standard protocol during  bolus administration of intravenous contrast. Multiplanar CT image reconstructions and MIPs were obtained to evaluate the vascular anatomy. CONTRAST:  OMNIPAQUE IOHEXOL 350 MG/ML SOLN COMPARISON:  08/31/2016 FINDINGS: Cardiovascular: No filling defects in the pulmonary arteries to suggest pulmonary emboli. Heart is normal size. Aorta is normal caliber. Mediastinum/Nodes: No mediastinal, hilar, or axillary adenopathy. Trachea and esophagus are unremarkable. Thyroid unremarkable. Lungs/Pleura: Lungs are clear. No focal airspace opacities or suspicious nodules. No effusions. Upper Abdomen: Imaging into the upper abdomen demonstrates no acute findings. Musculoskeletal: Chest wall soft tissues are unremarkable. No acute bony abnormality. Review of the MIP images confirms the above findings. IMPRESSION: No evidence of pulmonary embolus. No acute cardiopulmonary disease. Electronically Signed   By: Charlett Nose M.D.   On: 03/10/2020 03:43   ____________________________________________  PROCEDURES  Procedure(s) performed:   .Critical Care Performed by: Marily Memos, MD Authorized by: Marily Memos, MD   Critical care provider statement:  Critical care time (minutes):  45   Critical care was necessary to treat or prevent imminent or life-threatening deterioration of the following conditions:  Cardiac failure   Critical care was time spent personally by me on the following activities:  Discussions with consultants, evaluation of patient's response to treatment, examination of patient, ordering and performing treatments and interventions, ordering and review of laboratory studies, ordering and review of radiographic studies, pulse oximetry, re-evaluation of patient's condition, obtaining history from patient or surrogate and review of old charts   ____________________________________________  INITIAL IMPRESSION / ASSESSMENT AND PLAN    This patient presents to the ED for concern of chest pain  and syncope, this involves an extensive number of treatment options, and is a complaint that carries with it a high risk of complications and morbidity.  The differential diagnosis includes myocarditis, pulmonary embolus, ACS, dehydration possible viral illness, pericarditis with pleural effusion.  Additional history obtained:   Additional history obtained from noone  Previous records obtained and reviewed in epic  ED Course  Clinical Course as of 03/10/20 0427  Thu Mar 10, 2020  5916 Discussed with Dr. Donzetta Kohut, assuming CT PE is negative, will plan for admission with cardiology for further workup. If no beds and troponin rising, will xfer ed to ed to ensure prompt attention and ability to get catheterization if needed. Will hold off on bed request until CT [JM]    Clinical Course User Index [JM] Vaughn Beaumier, Barbara Cower, MD     Medicines ordered:  Medications  aspirin chewable tablet 324 mg (324 mg Oral Not Given 03/10/20 0224)  nitroGLYCERIN (NITROSTAT) SL tablet 0.4 mg (0.4 mg Sublingual Given 03/10/20 0236)  heparin ADULT infusion 100 units/mL (25000 units/274mL) (1,600 Units/hr Intravenous New Bag/Given 03/10/20 0348)  fentaNYL (SUBLIMAZE) injection 100 mcg (100 mcg Intravenous Given 03/10/20 0310)  iohexol (OMNIPAQUE) 350 MG/ML injection 100 mL (100 mLs Intravenous Contrast Given 03/10/20 0329)  heparin bolus via infusion 4,000 Units (4,000 Units Intravenous Bolus from Bag 03/10/20 0349)    Consultations Obtained:   I consulted cardiology and discussed lab and imaging findings who agrees with admission on heparin for CAD work-up   CRITICAL INTERVENTIONS:  . Heparin . Pain control . Rapid and serial EKGs  Reevaluation:  After the interventions stated above, I reevaluated the patient and found recurrence of severe chest pain.  Decreased responsiveness thought to be likely secondary to chest pain.  He was also tachypnea during that episode that is when the PE scan was ordered.'s came back  negative.  Patient's pain improved.  Started on heparin and given nitroglycerin and narcotics  FINAL IMPRESSION AND PLAN Final diagnoses:  None    . At this point patient has rising troponins in the setting of chest pain without other obvious cause for his troponins.  Could still be myocarditis however there is no EKG changes.  With a strong family history and his 2 weeks of symptoms that it suddenly got worse high concern for ACS.  We will plan for transfer to [facility] for further evaluation and management via cardiology.  Medical screening exam was performed and I feel the patient has had appropriate emergency department evaluation and work-up for their chief complaint and is stable for ADMISSION to the hospitalist time.  I discussed with Dr. Scarlette Calico with the cardiology service and discussed labs, imaging and other work-up in the emergency room.  They agree to admission for further management and work-up of said condition.  Third troponin even higher. Pain free currently.  In light of this result, will plan for ED to ED transfer for emergent cardiac evaluation and management. Dr. Pilar Plate accepting. Dr. Scarlette Calico aware.   ____________________________________________   NEW OUTPATIENT MEDICATIONS STARTED DURING THIS VISIT:  New Prescriptions   No medications on file    Note:  This note was prepared with assistance of Dragon voice recognition software. Occasional wrong-word or sound-a-like substitutions may have occurred due to the inherent limitations of voice recognition software.      Lorice Lafave, Barbara Cower, MD 03/10/20 [phone]

## 2020-03-10 NOTE — Progress Notes (Addendum)
ANTICOAGULATION CONSULT NOTE - Initial Consult  Pharmacy Consult for Heparin Indication: chest pain/ACS  Allergies  Allergen Reactions  . Coconut Oil Swelling    Throat swells    Patient Measurements: Height: 6' (182.9 cm) Weight: 134.1 kg (295 lb 10.2 oz) IBW/kg (Calculated) : 77.6 Heparin Dosing Weight: 108.1 kg   Vital Signs: Temp: 98.8 F (37.1 C) (02/10 1220) Temp Source: Oral (02/10 1220) BP: 116/78 (02/10 1458) Pulse Rate: 0 (02/10 1512)  Labs: Recent Labs    03/09/20 2331 03/10/20 0203 03/10/20 0446 03/10/20 0633 03/10/20 1157  HGB 14.9  --   --   --   --   HCT 44.2  --   --   --   --   PLT 257  --   --   --   --   HEPARINUNFRC  --   --   --   --  0.12*  CREATININE 0.89  --   --   --   --   TROPONINIHS 258* 768* 2,748* 3,475*  --     Estimated Creatinine Clearance: 179.8 mL/min (by C-G formula based on SCr of 0.89 mg/dL).   Medical History: Past Medical History:  Diagnosis Date  . ADHD (attention deficit hyperactivity disorder)   . Hypertension   . Myocarditis (HCC)     Medications:  Medications Prior to Admission  Medication Sig Dispense Refill Last Dose  . aspirin 81 MG chewable tablet Chew 81 mg by mouth once.   03/09/2020 at Unknown time  . calcium carbonate (TUMS EX) 750 MG chewable tablet Chew 1-2 tablets by mouth as needed for heartburn.   Past Week at Unknown time    Assessment: 25 yo male presented on 03/10/2020 with chest pain and elevated troponin found to have NSTEMI. LHC today found EF 25-35% and findings consistent with acute myopericarditis. Pharmacy consulted to resume heparin 8 hrs after sheath removal. Sheath removed at ~1500. Patient previously subtherapeutic heparin 1600 units/hr.  Goal of Therapy:  Heparin level 0.3-0.7 units/ml Monitor platelets by anticoagulation protocol: Yes   Plan:  Resume heparin at 1850 units/hr at 2300  Heparin level at 0700  Monitor heparin level, CBC and S/S of bleeding daily   Gerrit Halls,  PharmD Clinical Pharmacist  03/10/2020,3:20 PM  Addendum: Consult discontinued. No need to resume heparin per discussion with Dr. Bjorn Pippin.   Gerrit Halls, PharmD Clinical Pharmacist

## 2020-03-10 NOTE — Progress Notes (Signed)
Cath showed normal coronary arteries but LVEF appeared reduced.  Suspect myocarditis.  Likely also component of pericarditis given his chest pain, will start on colchicine and check inflammatory markers.  Echo ordered but not done yet.  Will plan cardiac MRI tomorrow to evaluate for myocarditis.  Discussed with MRI, will be able to do study tomorrow.  Will plan to start GDMT if systolic function reduced.

## 2020-03-10 NOTE — ED Notes (Addendum)
Pt was ordered fentanyl per MD for pain. 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 Pt stated that he was in bad pain and wanted to get the dose of medication but says he wants to refuse all future pain medications. EDP notified.

## 2020-03-10 NOTE — CV Procedure (Signed)
   Normal coronary arteries  Global left ventricular hypokinesis with EF 30 to 35%.  Elevated LVEDP 21 mmHg  Findings are compatible with systolic heart failure.  Clinical correlation is recommended.

## 2020-03-11 ENCOUNTER — Encounter (HOSPITAL_COMMUNITY): Payer: Self-pay | Admitting: Interventional Cardiology

## 2020-03-11 ENCOUNTER — Inpatient Hospital Stay (HOSPITAL_COMMUNITY): Payer: 59

## 2020-03-11 DIAGNOSIS — R079 Chest pain, unspecified: Secondary | ICD-10-CM

## 2020-03-11 DIAGNOSIS — I214 Non-ST elevation (NSTEMI) myocardial infarction: Secondary | ICD-10-CM

## 2020-03-11 DIAGNOSIS — F17200 Nicotine dependence, unspecified, uncomplicated: Secondary | ICD-10-CM

## 2020-03-11 DIAGNOSIS — I5021 Acute systolic (congestive) heart failure: Secondary | ICD-10-CM

## 2020-03-11 LAB — BASIC METABOLIC PANEL
Anion gap: 7 (ref 5–15)
BUN: 11 mg/dL (ref 6–20)
CO2: 24 mmol/L (ref 22–32)
Calcium: 8.8 mg/dL — ABNORMAL LOW (ref 8.9–10.3)
Chloride: 107 mmol/L (ref 98–111)
Creatinine, Ser: 0.87 mg/dL (ref 0.61–1.24)
GFR, Estimated: >60 mL/min (ref >60)
Glucose, Bld: 108 mg/dL — ABNORMAL HIGH (ref 70–99)
Potassium: 4.1 mmol/L (ref 3.5–5.1)
Sodium: 138 mmol/L (ref 135–145)

## 2020-03-11 LAB — LIPID PANEL
Cholesterol: 171 mg/dL (ref 0–200)
HDL: 28 mg/dL — ABNORMAL LOW (ref >40)
LDL Cholesterol: 126 mg/dL — ABNORMAL HIGH (ref 0–99)
Total CHOL/HDL Ratio: 6.1 RATIO
Triglycerides: 87 mg/dL (ref <150)
VLDL: 17 mg/dL (ref 0–40)

## 2020-03-11 LAB — CBC WITH DIFFERENTIAL/PLATELET
Abs Immature Granulocytes: 0.02 10*3/uL (ref 0.00–0.07)
Basophils Absolute: 0 10*3/uL (ref 0.0–0.1)
Basophils Relative: 1 %
Eosinophils Absolute: 0.3 10*3/uL (ref 0.0–0.5)
Eosinophils Relative: 5 %
HCT: 43.2 % (ref 39.0–52.0)
Hemoglobin: 15.1 g/dL (ref 13.0–17.0)
Immature Granulocytes: 0 %
Lymphocytes Relative: 32 %
Lymphs Abs: 2 10*3/uL (ref 0.7–4.0)
MCH: 29.3 pg (ref 26.0–34.0)
MCHC: 35 g/dL (ref 30.0–36.0)
MCV: 83.9 fL (ref 80.0–100.0)
Monocytes Absolute: 0.6 10*3/uL (ref 0.1–1.0)
Monocytes Relative: 9 %
Neutro Abs: 3.2 10*3/uL (ref 1.7–7.7)
Neutrophils Relative %: 53 %
Platelets: 268 10*3/uL (ref 150–400)
RBC: 5.15 MIL/uL (ref 4.22–5.81)
RDW: 11.9 % (ref 11.5–15.5)
WBC: 6.1 10*3/uL (ref 4.0–10.5)
nRBC: 0 % (ref 0.0–0.2)

## 2020-03-11 LAB — CBC
HCT: 42.5 % (ref 39.0–52.0)
Hemoglobin: 14.1 g/dL (ref 13.0–17.0)
MCH: 28.4 pg (ref 26.0–34.0)
MCHC: 33.2 g/dL (ref 30.0–36.0)
MCV: 85.5 fL (ref 80.0–100.0)
Platelets: 259 10*3/uL (ref 150–400)
RBC: 4.97 MIL/uL (ref 4.22–5.81)
RDW: 11.9 % (ref 11.5–15.5)
WBC: 6.2 10*3/uL (ref 4.0–10.5)
nRBC: 0 % (ref 0.0–0.2)

## 2020-03-11 LAB — SEDIMENTATION RATE: Sed Rate: 21 mm/hr — ABNORMAL HIGH (ref 0–16)

## 2020-03-11 LAB — HEMOGLOBIN A1C
Hgb A1c MFr Bld: 5.6 % (ref 4.8–5.6)
Mean Plasma Glucose: 114.02 mg/dL

## 2020-03-11 LAB — ECHOCARDIOGRAM COMPLETE
Area-P 1/2: 3.27 cm2
S' Lateral: 4.5 cm

## 2020-03-11 LAB — C-REACTIVE PROTEIN: CRP: 6.8 mg/dL — ABNORMAL HIGH (ref <1.0)

## 2020-03-11 MED ORDER — METOPROLOL TARTRATE 25 MG PO TABS: 12.5000 mg | ORAL_TABLET | Freq: Two times a day (BID) | ORAL | 3 refills | Status: AC

## 2020-03-11 MED ORDER — LOSARTAN POTASSIUM 50 MG PO TABS: 50.0000 mg | ORAL_TABLET | Freq: Every day | ORAL | 3 refills | Status: AC

## 2020-03-11 MED ORDER — GADOBUTROL 1 MMOL/ML IV SOLN
10.0000 mL | Freq: Once | INTRAVENOUS | Status: AC | PRN
  Administered 2020-03-11: 10 mL via INTRAVENOUS

## 2020-03-11 MED ORDER — COLCHICINE 0.6 MG PO TABS: 0.6000 mg | ORAL_TABLET | Freq: Two times a day (BID) | ORAL | 0 refills | Status: AC

## 2020-03-11 MED ORDER — NICOTINE 21 MG/24HR TD PT24: 21.0000 mg | MEDICATED_PATCH | Freq: Every day | TRANSDERMAL | 0 refills | Status: AC

## 2020-03-11 MED FILL — Ondansetron HCl Inj 4 MG/2ML (2 MG/ML): INTRAMUSCULAR | Qty: 2 | Status: AC

## 2020-03-11 NOTE — Progress Notes (Signed)
  Echocardiogram 2D Echocardiogram has been performed.  Leta Jungling M 03/11/2020, 11:57 AM

## 2020-03-11 NOTE — TOC Benefit Eligibility Note (Signed)
Transition of Care [facility]) Benefit Eligibility Note    Patient Details  Name: Evan Conner MRN: [id number] Date of Birth: 09/27/1995   Medication/Dose: COLCHICINE  TABLET  0.6 MG BID  Covered?: Yes     Prescription Coverage [facility]  Spoke with Person/Company/Phone Number:: REBECCA  @  [facility] RX # [phone] OPT-MEMBER  Co-Pay: $ 10.00  Prior Approval: No  Deductible:  (NO DEDUCTIBLE  OUT-OF-POCKET : [name] Phone Number: 03/11/2020, 12:14 PM

## 2020-03-11 NOTE — Progress Notes (Addendum)
Progress Note  Patient Name: Evan Conner Date of Encounter: 03/11/2020  [facility] Cardiologist: Rollene Rotunda, MD   Subjective   One dose of colchicine last night completely eliminated his pain. CP free.  Inpatient Medications    Scheduled Meds: . atorvastatin  80 mg Oral Daily  . colchicine  0.6 mg Oral BID  . metoprolol tartrate  12.5 mg Oral BID  . nicotine  21 mg Transdermal Daily  . sodium chloride flush  3 mL Intravenous Q12H   Continuous Infusions: . sodium chloride     PRN Meds: sodium chloride, acetaminophen, LORazepam, ondansetron (ZOFRAN) IV, oxyCODONE, sodium chloride flush   Vital Signs    Vitals:   03/10/20 1512 03/10/20 1520 03/10/20 2229 03/11/20 0427  BP:  113/71 123/60 127/66  Pulse: (!) 0 72 (!) 55 (!) 57  Resp: (!) 0 14 20 17   Temp:  99.2 F (37.3 C) 98.2 F (36.8 C) 98.2 F (36.8 C)  TempSrc:  Oral Oral Oral  SpO2: (!) 0%  99% 97%  Weight:      Height:        Intake/Output Summary (Last 24 hours) at 03/11/2020 0753 Last data filed at 03/10/2020 1646 Gross per 24 hour  Intake 812.89 ml  Output --  Net 812.89 ml   Last 3 Weights 03/09/2020 02/25/2019 10/15/2017  Weight (lbs) 295 lb 10.2 oz 295 lb 9.6 oz 275 lb  Weight (kg) 134.1 kg 134.083 kg 124.739 kg  Some encounter information is confidential and restricted. Go to Review Flowsheets activity to see all data.      Telemetry    Sinus to sinus bradycardia HR 40-50s - Personally Reviewed  ECG    No new tracings - Personally Reviewed  Physical Exam   GEN: No acute distress.   Neck: No JVD Cardiac: RRR, no murmurs, rubs, or gallops.  Respiratory: Clear to auscultation bilaterally. GI: Soft, nontender, non-distended  MS: No edema; No deformity. Neuro:  Nonfocal  Psych: Normal affect   Labs    High Sensitivity Troponin:   Recent Labs  Lab 03/09/20 2331 03/10/20 0203 03/10/20 0446 03/10/20 0633  TROPONINIHS 258* 768* 2,748* 3,475*      Chemistry Recent Labs   Lab 03/09/20 2331 03/11/20 0057  NA 133* 138  K 3.2* 4.1  CL 102 107  CO2 22 24  GLUCOSE 120* 108*  BUN 13 11  CREATININE 0.89 0.87  CALCIUM 8.8* 8.8*  GFRNONAA >60 >60  ANIONGAP 9 7     Hematology Recent Labs  Lab 03/09/20 2331 03/11/20 0057  WBC 8.4 6.2  RBC 5.23 4.97  HGB 14.9 14.1  HCT 44.2 42.5  MCV 84.5 85.5  MCH 28.5 28.4  MCHC 33.7 33.2  RDW 11.9 11.9  PLT 257 259    BNPNo results for input(s): BNP, PROBNP in the last 168 hours.   DDimer No results for input(s): DDIMER in the last 168 hours.   Radiology    DG Chest 2 View  Result Date: 03/09/2020 CLINICAL DATA:  Chest pain x2 weeks EXAM: CHEST - 2 VIEW COMPARISON:  Chest radiograph February 24, 2019. FINDINGS: The heart size and mediastinal contours are within normal limits. Similar mild peribronchial thickening. No focal consolidation. No pleural effusion. No pneumothorax. The visualized skeletal structures are unremarkable. IMPRESSION: Similar mild bronchitic changes, no new focal consolidation. Electronically Signed   By: February 26, 2019 MD   On: 03/09/2020 23:34   CT Angio Chest PE W and/or Wo Contrast  Result Date:  03/10/2020 CLINICAL DATA:  Chest pain EXAM: CT ANGIOGRAPHY CHEST WITH CONTRAST TECHNIQUE: Multidetector CT imaging of the chest was performed using the standard protocol during bolus administration of intravenous contrast. Multiplanar CT image reconstructions and MIPs were obtained to evaluate the vascular anatomy. CONTRAST:  OMNIPAQUE IOHEXOL 350 MG/ML SOLN COMPARISON:  08/31/2016 FINDINGS: Cardiovascular: No filling defects in the pulmonary arteries to suggest pulmonary emboli. Heart is normal size. Aorta is normal caliber. Mediastinum/Nodes: No mediastinal, hilar, or axillary adenopathy. Trachea and esophagus are unremarkable. Thyroid unremarkable. Lungs/Pleura: Lungs are clear. No focal airspace opacities or suspicious nodules. No effusions. Upper Abdomen: Imaging into the upper abdomen  demonstrates no acute findings. Musculoskeletal: Chest wall soft tissues are unremarkable. No acute bony abnormality. Review of the MIP images confirms the above findings. IMPRESSION: No evidence of pulmonary embolus. No acute cardiopulmonary disease. Electronically Signed   By: Charlett Nose M.D.   On: 03/10/2020 03:43   CARDIAC CATHETERIZATION  Result Date: 03/11/2020  Normal coronary arteries.  Right dominant anatomy.  Global left ventricular hypokinesis with ejection fraction in the 25 to 35% range.  LVEDP 21 mmHg.  This is consistent with systolic heart failure. RECOMMENDATIONS:  The elevated cardiac markers, decreased LV systolic function, and chest pain are consistent with acute myopericarditis.  Cardiac MRI  Anti-inflammatory therapy   Cardiac Studies   Echo pending  Cardiac MRI pending  Left heart cath 03/10/20:  Normal coronary arteries.  Right dominant anatomy.  Global left ventricular hypokinesis with ejection fraction in the 25 to 35% range.  LVEDP 21 mmHg.  This is consistent with systolic heart failure.  RECOMMENDATIONS:   The elevated cardiac markers, decreased LV systolic function, and chest pain are consistent with acute myopericarditis.  Cardiac MRI  Anti-inflammatory therapy   Patient Profile     25 y.o. male with PMH of ADHD, tobacco use and myocarditis who presented with chest pain and found to have an NSTEMI. LHC with normal coronaries but EF 25%.  Assessment & Plan    Chest pain - heart cath with normal coronaries - pt continues to have chest pain - has not received nitro since heart cath   Systolic heart failure - new diagnosis - echo pending - estimated EF 25% during heart cath - initiate goal directed medical therapy - started on lopressor 12.5 mg BID, sinus bradycardia in the 40-50s - no room to titrate - SBP in the 120s - consider starting low dose losartan 12.5 mg   Suspected myocarditis vs pericarditis - hx of prior myocarditis  with cardiac MR in 2018 - cardiac MRI pending - colchicine started 0.6 mg BID - Sed rate 21, CRP 6.8 - has only received one dose of colchicine but is now chest pain free - he was sick over the weekend with sore throat and body aches, did not test for COVID and is not vaccinated   Risk factor modification - strong family history of premature heart disease - A1c 5.6% 03/11/2020: Cholesterol 171; HDL 28; LDL Cholesterol 126; Triglycerides 87; VLDL 17 - given family history, would recommend lipid lower agent - has been started on 80 mg lipitor   Current smoker - strongly encouraged cessation    For questions or updates, please contact [facility] Please consult [email or web address] for contact info under        Signed, Marcelino Duster, PA  03/11/2020, 7:53 AM    History and all data above reviewed.  Patient examined.  I agree with the findings as above.  I saw this patient last year with a suspicion of possible myocarditis.  However he did not present for his echo or labs.  Now returns one year later with chest pain.  Pain is gone with colchicine. The patient exam reveals COR:RRR, no rub  ,  Lungs: Clear  ,  Abd: Positive bowel sounds, no rebound no guarding, Ext No edema  .  All available labs, radiology testing, previous records reviewed. Agree with documented assessment and plan. Chest pain:  Echo is ongoing but EF appears to be low normal.  MRI is being read but is consistent with myocarditis with low normal EF.  I will continue colchicine.  I will check a differential.  No indication for NSAIDs.  Ambulate and likely discharge in the AM.    Rollene Rotunda  11:22 AM  03/11/2020

## 2020-03-11 NOTE — Care Management (Signed)
03-11-20 Benefits check submitted for colchicine. Case Manager will follow for cost. Graves-Bigelow, Lamar Laundry, RN,BSN Case Manager

## 2020-03-11 NOTE — Discharge Summary (Addendum)
Discharge Summary    Patient ID: Evan NeedyCory Larimer MRN: [id number]; DOB: 10/27/1995  Admit date: 03/10/2020 Discharge date: 03/11/2020  PCP:  Kari BaarsHawkins, Edward, MD   [facility]  Cardiologist:  Rollene RotundaJames Raneisha Bress, MD    Discharge Diagnoses    Principal Problem:   NSTEMI (non-ST elevated myocardial infarction) [facility](HCC) Active Problems:   Chest pain   Elevated troponin   Acute viral pericarditis   Myocarditis [facility](HCC)   Current smoker   Acute systolic heart failure [facility](HCC)    Diagnostic Studies/Procedures    Cardiac MRI 03/11/20: pending final read  _____________   Echo 03/11/20: 1. Left ventricular ejection fraction, by estimation, is 30 to 35%. The  left ventricle has moderately decreased function. The left ventricle  demonstrates global hypokinesis. There is mild left ventricular  hypertrophy. Left ventricular diastolic  parameters were normal.  2. Right ventricular systolic function was not well visualized. The right  ventricular size is not well visualized.  3. The mitral valve is grossly normal. Trivial mitral valve  regurgitation.  4. The aortic valve is tricuspid. Aortic valve regurgitation is not  visualized.  5. The inferior vena cava is normal in size with greater than 50%  respiratory variability, suggesting right atrial pressure of 3 mmHg.   _____________  Left heart cath 03/10/20:  Normal coronary arteries.  Right dominant anatomy.  Global left ventricular hypokinesis with ejection fraction in the 25 to 35% range.  LVEDP 21 mmHg.  This is consistent with systolic heart failure.  RECOMMENDATIONS:   The elevated cardiac markers, decreased LV systolic function, and chest pain are consistent with acute myopericarditis.  Cardiac MRI  Anti-inflammatory therapy    History of Present Illness      Evan Conner is a [age] y.o. male with PMH of ADHD, tobacco use and myocarditis in 2018 with history of noncompliance with appointments who  presented with chest pain and found to have an NSTEMI.  Evan Conner is a 25 yo male with PMH noted above. He was seen back in 2018 with chest pain. He was dx with myocarditis and treated with symptoms resolved. Last seen in follow up in the office on 01/2019 with Dr. Antoine PocheHochrein. Notes indicate he had been seen in the ED several times with chest pain over the past 2 years with negative work up. He did report ongoing chest pain at this visit, which was felt to be more GI related.   Since that time he has been in his usual state of health until about 2 weeks ago. Developed episodes of sharp chest pain, but also dull, aching at times. Does not seem exertional in nature, but he has also noted being more fatigued. Has noted episodes while at work as well. Last night reports onset of sharp pain which was significantly worse than prior episodes, along with nausea and syncope. States he woke up and EMS was there. He was brought to [facility] and given 2 SL nitro while en route which helped with the sharp chest pain, but dull heaviness was still present.   In the ED his labs showed Na+ 133, K+ 3.2, Cr 0.89, hsTn 258>>768>>2748>>3475. EKG showed SR with IVCD, TWI in lead III, nonspecific ST changes possibly early repol. He was started on IV heparin. CT angio was negative for PE, did not note any coronary calcifications. Case discussed with overnight cardiology fellow and [facility]. He was pain free at the time of arrival.   Of note, does have strong  family hx with father having multiple stents, first placed in his 78s. Grandfather had CABG at 49. Mother with CHF.   Hospital Course     Consultants: none  Chest pain NSTEMI Patient underwent definitive angiography found to have normal coronaries. He does have risk factors, including strong family history and smoker - focus on risk factor modification.    Myocarditis  Final read on cardiac MRI pending, preliminary review appears  consistent with myocarditis. Colchicine started and he had complete resolution of his chest pain. Will continue treatment for 3 months. Will hold off on ibuprofen for now. Was sick previous weekend with core throat and body aches, COVID negative, unvaccinated.    Systolic heart failure - new diagnosis EF estimated at 25% during heart cath. Follow up echo with EF of 30-35%, normal diastolic function, and no significant valvular disease. Started low dose lopressor 12.5 mg BID - sinus bradycardia in the 40-50s, no room for titration. Will start losartan 50 mg. Titrate to 50 mg daily at outpatient follow up.    Current smoker Has been strongly encouraged to stop smoking.   Noncompliance  Encourage follow up.   Hyperlipidemia  03/11/2020: Cholesterol 171; HDL 28; LDL Cholesterol 126; Triglycerides 87; VLDL 17   Did the patient have an acute coronary syndrome (MI, NSTEMI, STEMI, etc) this admission?:  No                               Did the patient have a percutaneous coronary intervention (stent / angioplasty)?:  No.       _____________  Discharge Vitals Blood pressure (!) 141/86, pulse (!) 57, temperature 98.1 F (36.7 C), temperature source Oral, resp. rate 18, height 6' (1.829 m), weight 134.1 kg, SpO2 98 %.  Filed Weights   03/09/20 2259  Weight: 134.1 kg    Labs & Radiologic Studies    CBC Recent Labs    03/11/20 0057 03/11/20 1357  WBC 6.2 6.1  NEUTROABS  --  3.2  HGB 14.1 15.1  HCT 42.5 43.2  MCV 85.5 83.9  PLT 259 268   Basic Metabolic Panel Recent Labs    73/53/29 2331 03/11/20 0057  NA 133* 138  K 3.2* 4.1  CL 102 107  CO2 22 24  GLUCOSE 120* 108*  BUN 13 11  CREATININE 0.89 0.87  CALCIUM 8.8* 8.8*   Liver Function Tests No results for input(s): AST, ALT, ALKPHOS, BILITOT, PROT, ALBUMIN in the last 72 hours. No results for input(s): LIPASE, AMYLASE in the last 72 hours. High Sensitivity Troponin:   Recent Labs  Lab 03/09/20 2331 03/10/20 0203  03/10/20 0446 03/10/20 0633  TROPONINIHS 258* 768* 2,748* 3,475*    BNP Invalid input(s): POCBNP D-Dimer No results for input(s): DDIMER in the last 72 hours. Hemoglobin A1C Recent Labs    03/11/20 0057  HGBA1C 5.6   Fasting Lipid Panel Recent Labs    03/11/20 0057  CHOL 171  HDL 28*  LDLCALC 126*  TRIG 87  CHOLHDL 6.1   Thyroid Function Tests No results for input(s): TSH, T4TOTAL, T3FREE, THYROIDAB in the last 72 hours.  Invalid input(s): FREET3 _____________  DG Chest 2 View  Result Date: 03/09/2020 CLINICAL DATA:  Chest pain x2 weeks EXAM: CHEST - 2 VIEW COMPARISON:  Chest radiograph February 24, 2019. FINDINGS: The heart size and mediastinal contours are within normal limits. Similar mild peribronchial thickening. No focal consolidation. No pleural effusion. No pneumothorax.  The visualized skeletal structures are unremarkable. IMPRESSION: Similar mild bronchitic changes, no new focal consolidation. Electronically Signed   By: Maudry Mayhew MD   On: 03/09/2020 23:34   CT Angio Chest PE W and/or Wo Contrast  Result Date: 03/10/2020 CLINICAL DATA:  Chest pain EXAM: CT ANGIOGRAPHY CHEST WITH CONTRAST TECHNIQUE: Multidetector CT imaging of the chest was performed using the standard protocol during bolus administration of intravenous contrast. Multiplanar CT image reconstructions and MIPs were obtained to evaluate the vascular anatomy. CONTRAST:  OMNIPAQUE IOHEXOL 350 MG/ML SOLN COMPARISON:  08/31/2016 FINDINGS: Cardiovascular: No filling defects in the pulmonary arteries to suggest pulmonary emboli. Heart is normal size. Aorta is normal caliber. Mediastinum/Nodes: No mediastinal, hilar, or axillary adenopathy. Trachea and esophagus are unremarkable. Thyroid unremarkable. Lungs/Pleura: Lungs are clear. No focal airspace opacities or suspicious nodules. No effusions. Upper Abdomen: Imaging into the upper abdomen demonstrates no acute findings. Musculoskeletal: Chest wall soft  tissues are unremarkable. No acute bony abnormality. Review of the MIP images confirms the above findings. IMPRESSION: No evidence of pulmonary embolus. No acute cardiopulmonary disease. Electronically Signed   By: Charlett Nose M.D.   On: 03/10/2020 03:43   CARDIAC CATHETERIZATION  Result Date: 03/11/2020  Normal coronary arteries.  Right dominant anatomy.  Global left ventricular hypokinesis with ejection fraction in the 25 to 35% range.  LVEDP 21 mmHg.  This is consistent with systolic heart failure. RECOMMENDATIONS:  The elevated cardiac markers, decreased LV systolic function, and chest pain are consistent with acute myopericarditis.  Cardiac MRI  Anti-inflammatory therapy  ECHOCARDIOGRAM COMPLETE  Result Date: 03/11/2020    ECHOCARDIOGRAM REPORT   Patient Name:   MALAKAI SCHOENHERR Date of Exam: 03/11/2020 Medical Rec #:  [id number]       Height:       72.0 in Accession #:    [id number]      Weight:       295.6 lb Date of Birth:  1995-12-17        BSA:          2.516 m Patient Age:    24 years        BP:           116/57 mmHg Patient Gender: M               HR:           66 bpm. Exam Location:  Inpatient Procedure: 2D Echo                                 MODIFIED REPORT: This report was modified by Zoila Shutter MD on 03/11/2020 due to Images reviewed with Dr. Bjorn Pippin - MRI suggests low normal LVEF. Endocardial definition is poor       in this study, however, LVEF may be higher than previously reported.  Indications:     Chest Pain R07.9  History:         Patient has prior history of Echocardiogram examinations, most                  recent 08/31/2016.                  Suspected myocarditis vs pericarditis.  Sonographer:     Leta Jungling RDCS Referring Phys:  [phone] Barry Dienes [facility] Diagnosing Phys: Zoila Shutter MD  Sonographer Comments: Technically difficult study due to poor echo windows. IMPRESSIONS  1. Technically  difficult study with poor endocardial definition. Left ventricular ejection fraction, by  estimation, is 45 to 50%. The left ventricle has mildly decreased function. The left ventricle demonstrates global hypokinesis. There is mild left ventricular hypertrophy. Left ventricular diastolic parameters were normal.  2. Right ventricular systolic function was not well visualized. The right ventricular size is not well visualized.  3. The mitral valve is grossly normal. Trivial mitral valve regurgitation.  4. The aortic valve is tricuspid. Aortic valve regurgitation is not visualized.  5. The inferior vena cava is normal in size with greater than 50% respiratory variability, suggesting right atrial pressure of 3 mmHg. Comparison(s): Prior images unable to be directly viewed, comparison made by report only. Changes from prior study are noted. 08/31/2016: LVEF 50-55%. Conclusion(s)/Recommendation(s): Consider Definity contrast or MRI to further evaluate myocardium. FINDINGS  Left Ventricle: Technically difficult study with poor endocardial definition. Left ventricular ejection fraction, by estimation, is 45 to 50%. The left ventricle has mildly decreased function. The left ventricle demonstrates global hypokinesis. The left  ventricular internal cavity size was normal in size. There is mild left ventricular hypertrophy. Left ventricular diastolic parameters were normal. Right Ventricle: The right ventricular size is not well visualized. Right vetricular wall thickness was not well visualized. Right ventricular systolic function was not well visualized. Left Atrium: Left atrial size was normal in size. Right Atrium: Right atrial size was normal in size. Pericardium: There is no evidence of pericardial effusion. Mitral Valve: The mitral valve is grossly normal. Trivial mitral valve regurgitation. Tricuspid Valve: The tricuspid valve is grossly normal. Tricuspid valve regurgitation is trivial. Aortic Valve: The aortic valve is tricuspid. Aortic valve regurgitation is not visualized. Pulmonic Valve: The pulmonic valve  was normal in structure. Pulmonic valve regurgitation is not visualized. Aorta: The aortic root is normal in size and structure. Venous: The inferior vena cava is normal in size with greater than 50% respiratory variability, suggesting right atrial pressure of 3 mmHg. IAS/Shunts: No atrial level shunt detected by color flow Doppler.  LEFT VENTRICLE PLAX 2D LVIDd:         5.30 cm Diastology LVIDs:         4.50 cm LV e' medial:    10.00 cm/s LV PW:         1.00 cm LV E/e' medial:  6.4 LV IVS:        1.20 cm LV e' lateral:   11.40 cm/s                        LV E/e' lateral: 5.6  RIGHT VENTRICLE RV S prime:     12.70 cm/s TAPSE (M-mode): 2.1 cm LEFT ATRIUM             Index       RIGHT ATRIUM           Index LA diam:        3.80 cm 1.51 cm/m  RA Area:     14.00 cm LA Vol (A2C):   28.6 ml 11.37 ml/m RA Volume:   33.40 ml  13.28 ml/m LA Vol (A4C):   36.9 ml 14.67 ml/m LA Biplane Vol: 32.0 ml 12.72 ml/m  AORTIC VALVE LVOT Vmax:   71.10 cm/s LVOT Vmean:  52.100 cm/s LVOT VTI:    0.174 m MITRAL VALVE MV Area (PHT): 3.27 cm    SHUNTS MV Decel Time: 232 msec    Systemic VTI: 0.17 m MV E velocity: 64.30 cm/s MV A  velocity: 39.00 cm/s MV E/A ratio:  1.65 Zoila Shutter MD Electronically signed by Zoila Shutter MD Signature Date/Time: 03/11/2020/12:30:44 PM    Final (Updated)    Disposition   Pt is being discharged home today in good condition.  Follow-up Plans & Appointments     Follow-up Information    Antonieta Iba, MD Follow up on 03/18/2020.   Specialty: Cardiology Why: 3:20 pm for hospital follow up Contact information: [address] [place] [address] [phone]              Discharge Instructions    Diet - low sodium heart healthy   Complete by: As directed    Discharge instructions   Complete by: As directed    No driving for 2 days. No lifting over 5 lbs for 1 week. No sexual activity for 1 week. You may return to work in 1 week. Keep procedure site clean & dry.  If you notice increased pain, swelling, bleeding or pus, call/return!  You may shower, but no soaking baths/hot tubs/pools for 1 week.   Increase activity slowly   Complete by: As directed       Discharge Medications   Allergies as of 03/11/2020      Reactions   Coconut Oil Swelling   Throat swells      Medication List    STOP taking these medications   aspirin 81 MG chewable tablet   calcium carbonate 750 MG chewable tablet Commonly known as: TUMS EX     TAKE these medications   colchicine 0.6 MG tablet Take 1 tablet (0.6 mg total) by mouth 2 (two) times daily.   losartan 50 MG tablet Commonly known as: Cozaar Take 1 tablet (50 mg total) by mouth daily.   metoprolol tartrate 25 MG tablet Commonly known as: LOPRESSOR Take 0.5 tablets (12.5 mg total) by mouth 2 (two) times daily.   nicotine 21 mg/24hr patch Commonly known as: NICODERM CQ - dosed in mg/24 hours Place 1 patch (21 mg total) onto the skin daily. Start taking on: March 12, 2020          Outstanding Labs/Studies   Follow up echo in 3 months  Duration of Discharge Encounter   Greater than 30 minutes including physician time.  Signed, Roe Rutherford Duke, PA 03/11/2020, 3:52 PM   ADDENDUM:  I saw the patient again after discharge.   His EF is mildly reduced.  Initial reading was lower and revised up.  MRI suggests it is about 50%.  I reviewed with him that last year he was supposed to get an echo and inflammatory markers and he did not comply with that follow up.  He admits to that but says that he will do better.  He understands the consequences.  He would like to be seen in [place] as it is closer to work.  I have arranged follow up and left a message for Dr. Mariah Milling who will see him next week.  Meds as above.  He knows he cannot participate in strenuous work and that he needs to return to the ED with pain, SOB or other symptoms.

## 2020-03-15 NOTE — Progress Notes (Deleted)
Cardiology Office Note  Date:  03/15/2020   ID:  Evan Conner, DOB [birth year], MRN [id number]  PCP:  Evan Baars, MD   No chief complaint on file.   HPI:  Evan Conner is a 25 y.o. male with PMH of ADHD, tobacco use and myocarditis in 2018 with history of noncompliance with appointments who presented with chest pain and found to have an NSTEMI.  Cath 03/10/2020  Normal coronary arteries.  Right dominant anatomy.  Global left ventricular hypokinesis with ejection fraction in the 25 to 35% range.  LVEDP 21 mmHg.  This is consistent with systolic heart failure.  RECOMMENDATIONS:   The elevated cardiac markers, decreased LV systolic function, and chest pain are consistent with acute myopericarditis.  Cardiac MRI  Anti-inflammatory therapy  Cardiac MRI 1. Findings consistent with acute myocarditis, including elevated native T1, T2, and ECV values, particularly in mid inferolateral wall. In addition there is basal to mid septal midwall and lateral subepicardial LGE, consistent with myocarditis. 2.  Normal LV size and systolic function (EF 53 3.  Normal RV size and systolic function (EF 52%)    Echo 03/11/20: 1. Left ventricular ejection fraction, by estimation, is 30 to 35%. The  left ventricle has moderately decreased function. The left ventricle  demonstrates global hypokinesis. There is mild left ventricular  hypertrophy. Left ventricular diastolic  parameters were normal.  2. Right ventricular systolic function was not well visualized. The right  ventricular size is not well visualized.  3. The mitral valve is grossly normal. Trivial mitral valve  regurgitation.  4. The aortic valve is tricuspid. Aortic valve regurgitation is not  visualized.  5. The inferior vena cava is normal in size with greater than 50%  respiratory variability, suggesting right atrial pressure of 3 mmHg.   Colchicine started and he had complete resolution of his chest pain. Will  continue treatment for 3 months.     Mr.Szychowiczis a 25 yo male with PMH noted above. He was seen back in 2018 with chest pain. He was dx with myocarditis and treated with symptoms resolved. Last seen in follow up in the office on 01/2019 with Dr. Antoine Poche. Notes indicate he had been seen in the ED several times with chest pain over the past 2 years with negative work up. He did report ongoing chest pain at this visit, which was felt to be more GI related.   Since that time he has been in his usual state of health until about 2 weeks ago. Developed episodes of sharp chest pain, but also dull, aching at times. Does not seem exertional in nature, but he has also noted being more fatigued. Has noted episodes while at work as well. Last night reports onset of sharp pain which was significantly worse than prior episodes, along with nausea and syncope. States he woke up and EMS was there. He was brought to [facility] and given 2 SL nitro while en route which helped with the sharp chest pain, but dull heaviness was still present.   In the ED his labs showed Na+ 133, K+ 3.2, Cr 0.89, hsTn 258>>768>>2748>>3475. EKG showed SR with IVCD, TWI in lead III, nonspecific ST changes possibly early repol. He was started on IV heparin. CT angio was negative for PE, did not note any coronary calcifications. Case discussed with overnight cardiology fellow and transferred to [facility] for further management. He was pain free at the time of arrival.   Of note, does have strong family hx with father having multiple stents,  first placed in his 3s. Grandfather had CABG at 19. Mother with CHF.    PMH:   has a past medical history of ADHD (attention deficit hyperactivity disorder), Hypertension, and Myocarditis (HCC).  PSH:    Past Surgical History:  Procedure Laterality Date  . LEFT HEART CATH AND CORONARY ANGIOGRAPHY N/A 03/10/2020   Procedure: LEFT HEART CATH AND CORONARY ANGIOGRAPHY;  Surgeon: Lyn Records, MD;   Location: [facility] INVASIVE CV LAB;  Service: Cardiovascular;  Laterality: N/A;  . TONSILLECTOMY    . WRIST SURGERY Right     Current Outpatient Medications  Medication Sig Dispense Refill  . colchicine 0.6 MG tablet Take 1 tablet (0.6 mg total) by mouth 2 (two) times daily. 180 tablet 0  . losartan (COZAAR) 50 MG tablet Take 1 tablet (50 mg total) by mouth daily. 90 tablet 3  . metoprolol tartrate (LOPRESSOR) 25 MG tablet Take 0.5 tablets (12.5 mg total) by mouth 2 (two) times daily. 180 tablet 3  . nicotine (NICODERM CQ - DOSED IN MG/24 HOURS) 21 mg/24hr patch Place 1 patch (21 mg total) onto the skin daily. 28 patch 0   No current facility-administered medications for this visit.     Allergies:   Coconut oil   Social History:  The patient  reports that he has been smoking. He has been smoking about 1.50 packs per day. He has quit using smokeless tobacco. He reports current drug use. Drug: Marijuana. He reports that he does not drink alcohol.   Family History:   family history includes CAD in his cousin and father.    Review of Systems: ROS   PHYSICAL EXAM: VS:  There were no vitals taken for this visit. , BMI There is no height or weight on file to calculate BMI. GEN: Well nourished, well developed, in no acute distress HEENT: normal Neck: no JVD, carotid bruits, or masses Cardiac: RRR; no murmurs, rubs, or gallops,no edema  Respiratory:  clear to auscultation bilaterally, normal work of breathing GI: soft, nontender, nondistended, + BS MS: no deformity or atrophy Skin: warm and dry, no rash Neuro:  Strength and sensation are intact Psych: euthymic mood, full affect    Recent Labs: 03/11/2020: BUN 11; Creatinine, Ser 0.87; Hemoglobin 15.1; Platelets 268; Potassium 4.1; Sodium 138    Lipid Panel Lab Results  Component Value Date   CHOL 171 03/11/2020   HDL 28 (L) 03/11/2020   LDLCALC 126 (H) 03/11/2020   TRIG 87 03/11/2020      Wt Readings from Last 3 Encounters:   03/09/20 295 lb 10.2 oz (134.1 kg)  02/25/19 295 lb 9.6 oz (134.1 kg)  10/15/17 275 lb (124.7 kg)       ASSESSMENT AND PLAN:  Problem List Items Addressed This Visit   None      Disposition:   F/U  12 months   Total encounter time more than 25 minutes  Greater than 50% was spent in counseling and coordination of care with the patient    Signed, Dossie Arbour, M.D., Ph.D. [facility] [place], [place] [phone]

## 2020-03-17 ENCOUNTER — Ambulatory Visit: Payer: 59 | Admitting: Cardiology

## 2020-03-18 ENCOUNTER — Ambulatory Visit: Payer: 59 | Admitting: Cardiovascular Disease

## 2020-03-18 DIAGNOSIS — I5021 Acute systolic (congestive) heart failure: Secondary | ICD-10-CM

## 2020-03-18 DIAGNOSIS — I301 Infective pericarditis: Secondary | ICD-10-CM

## 2020-03-18 DIAGNOSIS — I514 Myocarditis, unspecified: Secondary | ICD-10-CM

## 2020-03-18 DIAGNOSIS — I214 Non-ST elevation (NSTEMI) myocardial infarction: Secondary | ICD-10-CM

## 2020-03-21 ENCOUNTER — Encounter: Payer: Self-pay | Admitting: Cardiovascular Disease

## 2020-03-24 ENCOUNTER — Telehealth (HOSPITAL_COMMUNITY): Payer: Self-pay

## 2020-03-24 NOTE — Telephone Encounter (Signed)
Attempted to call to schedule appt for HV TOC clinic. No answer, vm full. Will try spouse number given in chart.   Ozella Rocks, RN, BSN Heart Failure Nurse Navigator [phone]

## 2020-03-24 NOTE — Telephone Encounter (Signed)
Call attempted to schedule HV TOC appt . HIPPA appropriate VM left with callback number on mothers phone.  Ozella Rocks, RN, BSN Heart Failure Nurse Navigator [phone]

## 2020-07-06 ENCOUNTER — Emergency Department (HOSPITAL_COMMUNITY)
Admission: EM | Admit: 2020-07-06 | Discharge: 2020-07-06 | Discharge status: Against Medical Advice | Payer: 59 | Attending: Emergency Medicine | Admitting: Emergency Medicine

## 2020-07-06 ENCOUNTER — Other Ambulatory Visit: Payer: Self-pay

## 2020-07-06 ENCOUNTER — Encounter (HOSPITAL_COMMUNITY): Payer: Self-pay

## 2020-07-06 DIAGNOSIS — R0602 Shortness of breath: Secondary | ICD-10-CM | POA: Insufficient documentation

## 2020-07-06 DIAGNOSIS — R079 Chest pain, unspecified: Secondary | ICD-10-CM

## 2020-07-06 DIAGNOSIS — Z5321 Procedure and treatment not carried out due to patient leaving prior to being seen by health care provider: Secondary | ICD-10-CM | POA: Insufficient documentation

## 2020-07-06 LAB — BASIC METABOLIC PANEL
Anion gap: 7 (ref 5–15)
BUN: 10 mg/dL (ref 6–20)
CO2: 25 mmol/L (ref 22–32)
Calcium: 9 mg/dL (ref 8.9–10.3)
Chloride: 106 mmol/L (ref 98–111)
Creatinine, Ser: 0.92 mg/dL (ref 0.61–1.24)
GFR, Estimated: >60 mL/min (ref >60)
Glucose, Bld: 97 mg/dL (ref 70–99)
Potassium: 3.7 mmol/L (ref 3.5–5.1)
Sodium: 138 mmol/L (ref 135–145)

## 2020-07-06 LAB — CBC WITH DIFFERENTIAL/PLATELET
Abs Immature Granulocytes: 0.03 10*3/uL (ref 0.00–0.07)
Basophils Absolute: 0.1 10*3/uL (ref 0.0–0.1)
Basophils Relative: 1 %
Eosinophils Absolute: 0.4 10*3/uL (ref 0.0–0.5)
Eosinophils Relative: 4 %
HCT: 45.6 % (ref 39.0–52.0)
Hemoglobin: 15 g/dL (ref 13.0–17.0)
Immature Granulocytes: 0 %
Lymphocytes Relative: 26 %
Lymphs Abs: 2.4 10*3/uL (ref 0.7–4.0)
MCH: 29 pg (ref 26.0–34.0)
MCHC: 32.9 g/dL (ref 30.0–36.0)
MCV: 88.2 fL (ref 80.0–100.0)
Monocytes Absolute: 0.8 10*3/uL (ref 0.1–1.0)
Monocytes Relative: 9 %
Neutro Abs: 5.7 10*3/uL (ref 1.7–7.7)
Neutrophils Relative %: 60 %
Platelets: 336 10*3/uL (ref 150–400)
RBC: 5.17 MIL/uL (ref 4.22–5.81)
RDW: 12 % (ref 11.5–15.5)
WBC: 9.5 10*3/uL (ref 4.0–10.5)
nRBC: 0 % (ref 0.0–0.2)

## 2020-07-06 LAB — TROPONIN I (HIGH SENSITIVITY): Troponin I (High Sensitivity): 4 ng/L (ref <18)

## 2020-07-06 NOTE — ED Notes (Signed)
Called pt 3 x no answer

## 2020-07-06 NOTE — ED Provider Notes (Signed)
Emergency Medicine Provider Triage Evaluation Note  Evan Conner , a 25 y.o. male  was evaluated in triage.  Pt complains of chest pain that has been ongoing for the last few days. Has been digging a pool this last week. Hx myocarditis.  Review of Systems  Positive: Chest pain, sob Negative: ble swelling  Physical Exam  BP 123/75 (BP Location: Left Arm)   Pulse 71   Temp 98.5 F (36.9 C)   Resp 18   SpO2 99%  Gen:   Awake, no distress   Resp:  Normal effort  MSK:   Moves extremities without difficulty  Other:  Heart with rrr, no tachypnea  Medical Decision Making  Medically screening exam initiated at 1:36 PM.  Appropriate orders placed.  Evan Conner was informed that the remainder of the evaluation will be completed by another provider, this initial triage assessment does not replace that evaluation, and the importance of remaining in the ED until their evaluation is complete.     Rayne Du 07/06/20 1341    Cathren Laine, MD 07/08/20 [phone]

## 2020-07-06 NOTE — ED Notes (Signed)
Moving pt off the floor no answer.

## 2020-07-06 NOTE — ED Triage Notes (Signed)
Patient arrived by [facility] with complaint of chest pain x 1 week. Patient has been digging a pool all week and the SOB/CP with exertion. Patient received asa and 1 sl ntg and states it helped a little but this is different from his MI x 2. Pain worse with inspiration and movement

## 2022-05-22 IMAGING — DX DG CHEST 2V
2 series · 2 of 2 positions shown · non-contrast
Comparison: Chest radiograph February 24, 2019.

CLINICAL DATA: Chest pain x2 weeks

EXAM:
CHEST - 2 VIEW

[chest pa]
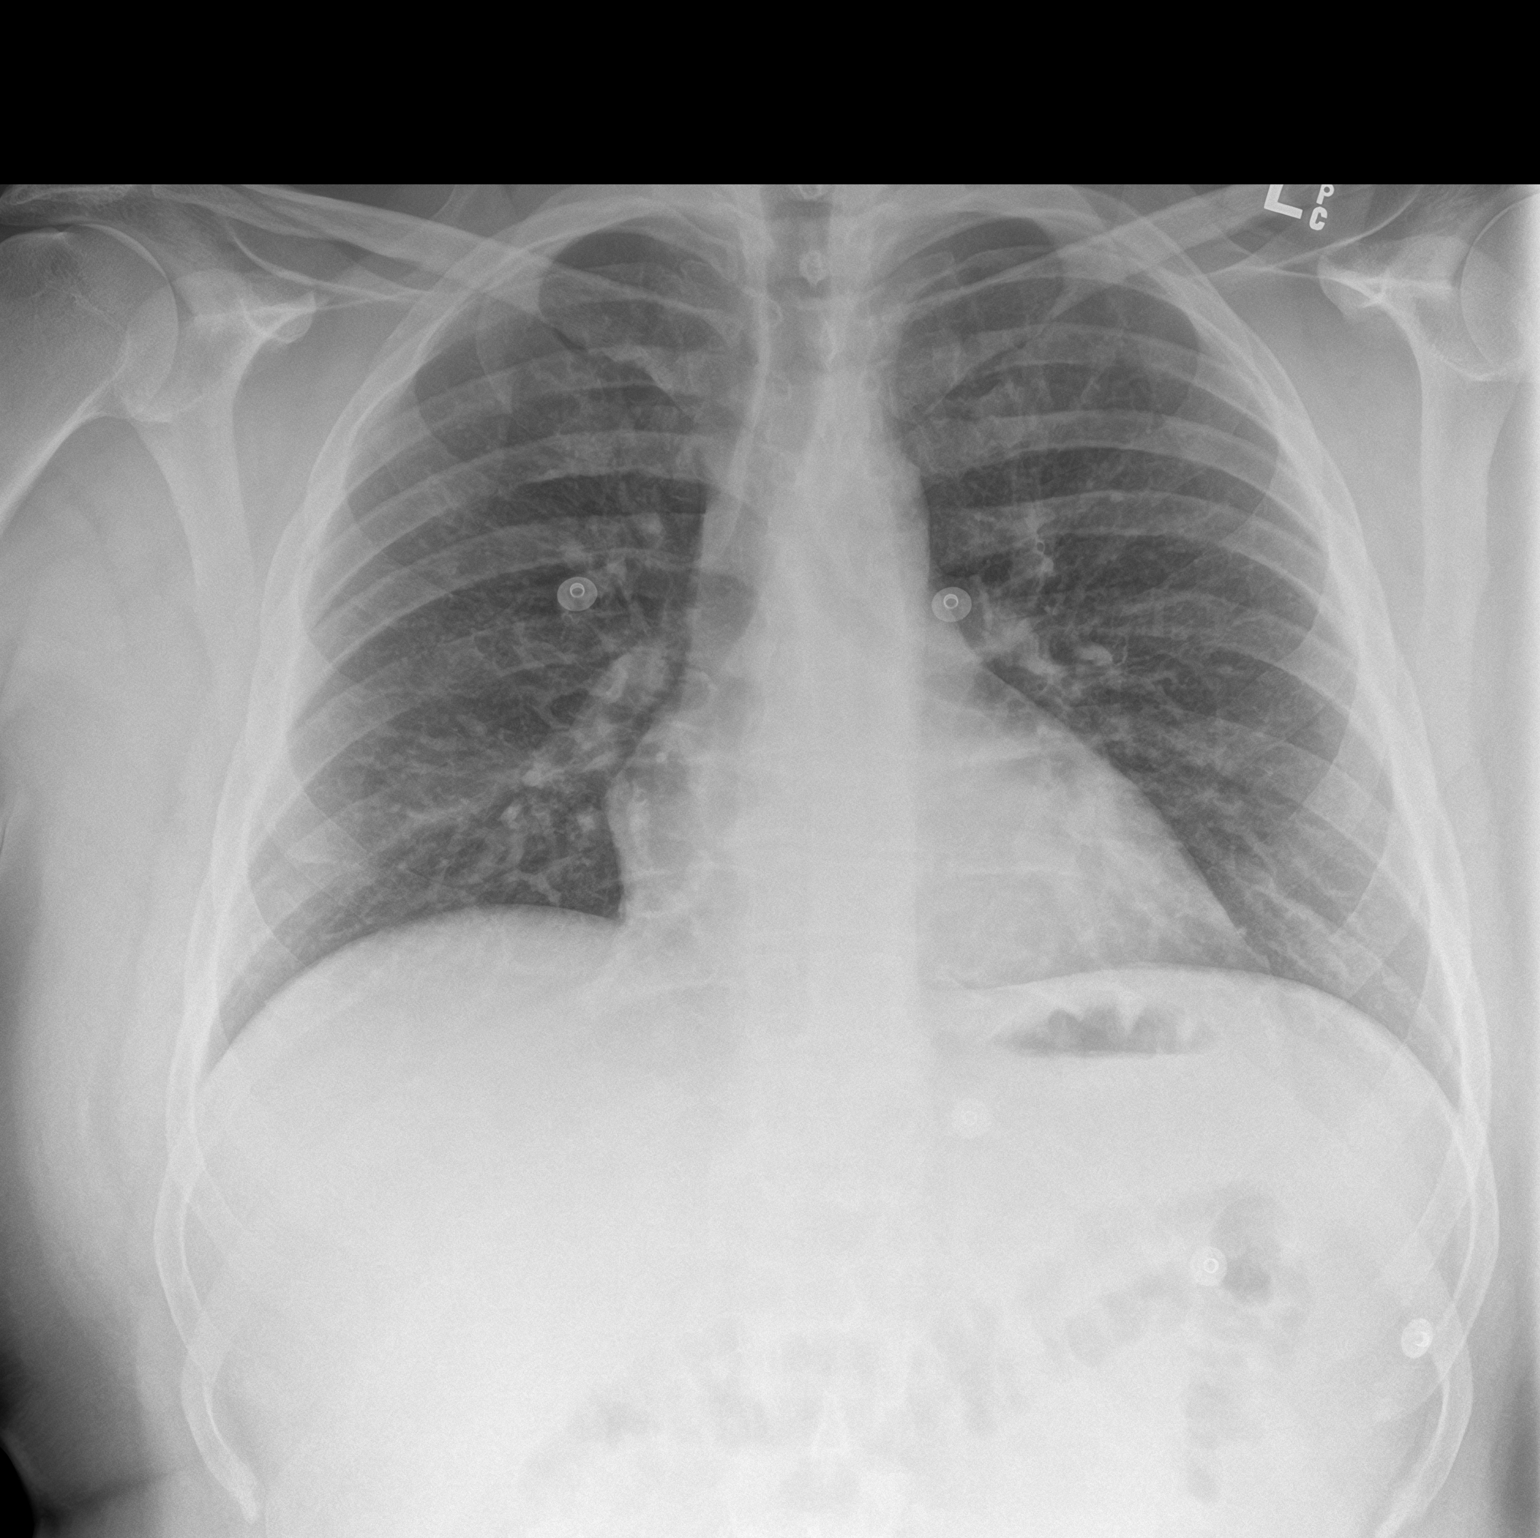

[chest lat]
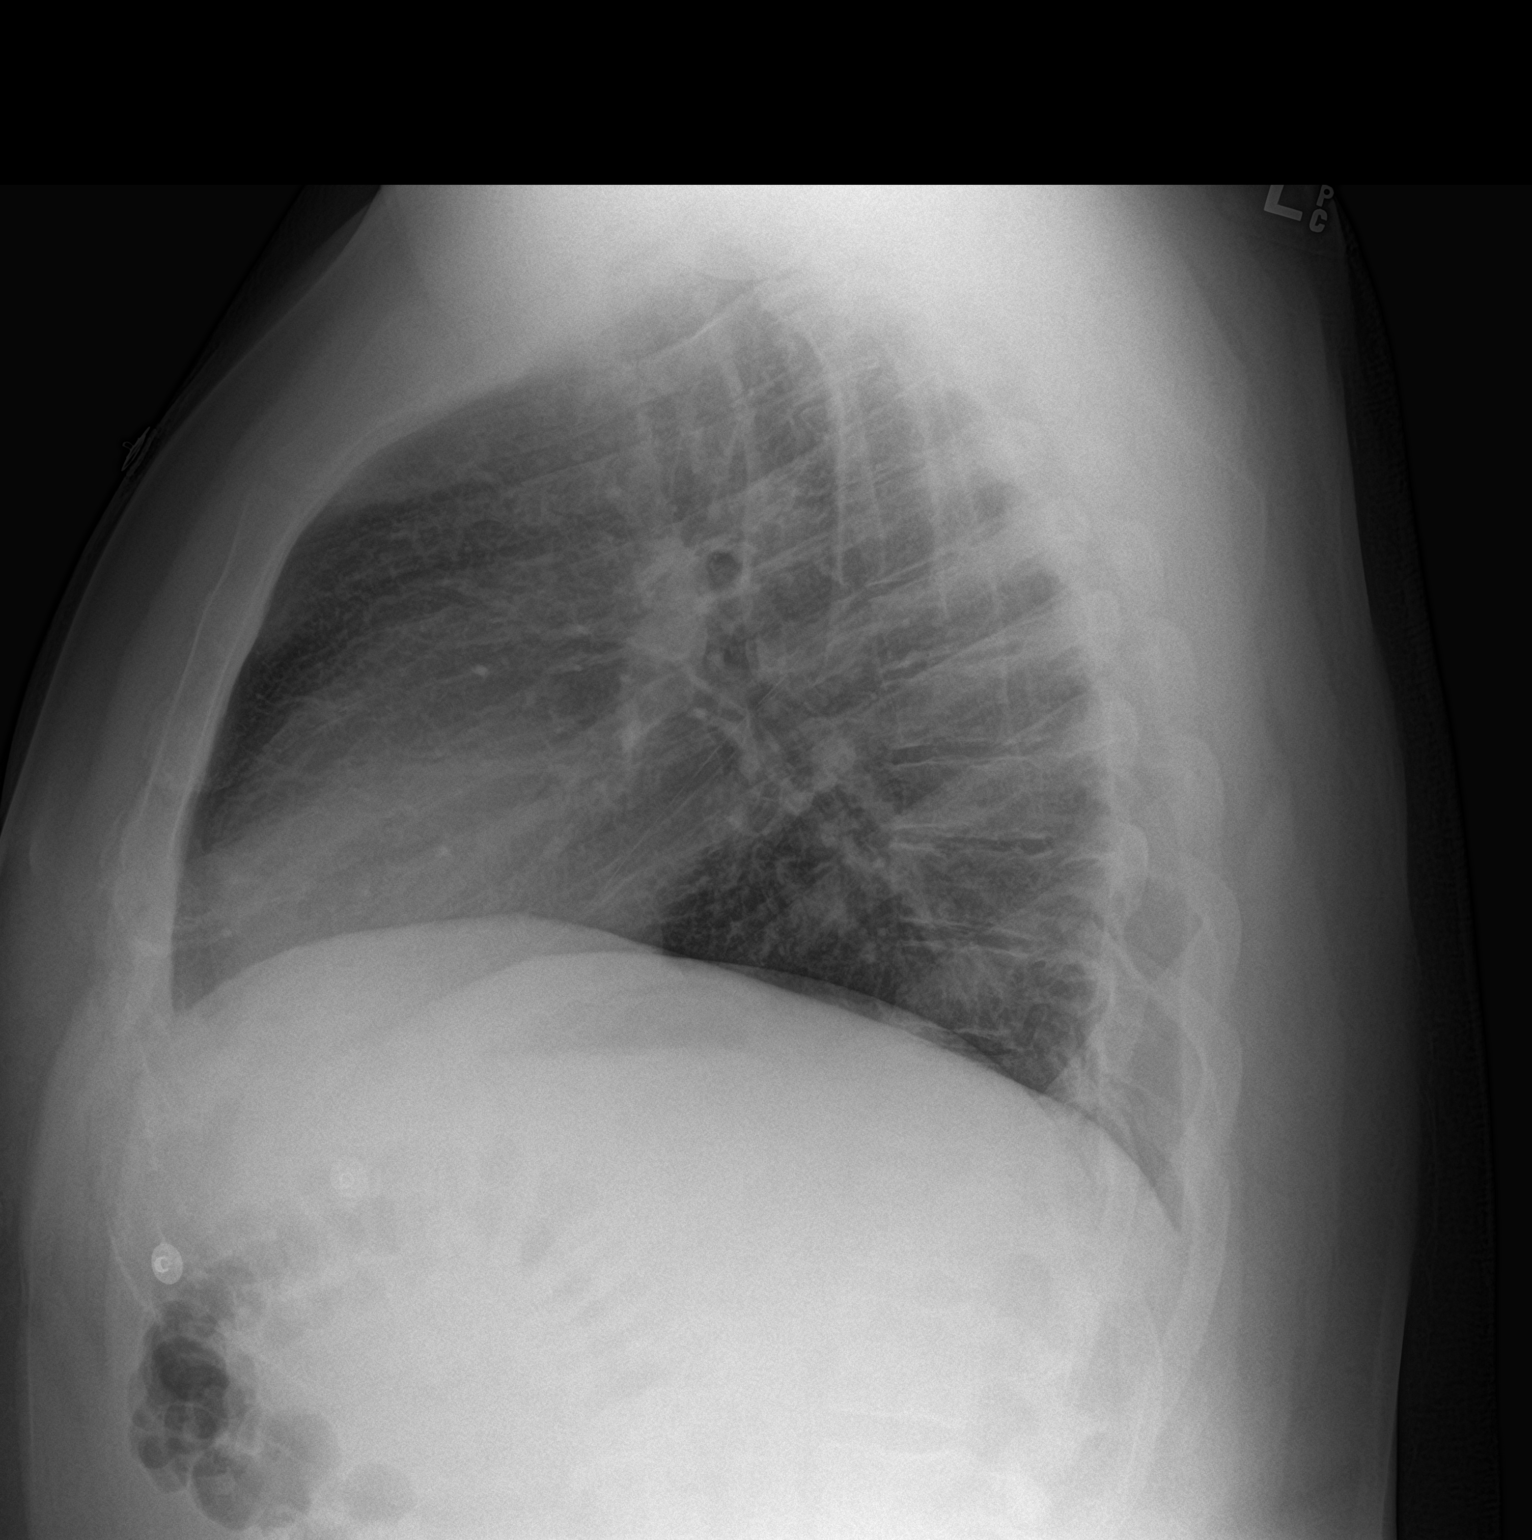

[2 of 2 positions shown; findings below may reference images not displayed]

FINDINGS: The heart size and mediastinal contours are within normal limits.
Similar mild peribronchial thickening. No focal consolidation. No
pleural effusion. No pneumothorax. The visualized skeletal
structures are unremarkable.
IMPRESSION: Similar mild bronchitic changes, no new focal consolidation.

## 2022-05-23 IMAGING — CT CT ANGIO CHEST
2 of 6 series · 17 of 46 positions shown · IV contrast (Omnipaque or Isovue)
Comparison: 08/31/2016

CLINICAL DATA: Chest pain

EXAM:
CT ANGIOGRAPHY CHEST WITH CONTRAST
TECHNIQUE: Multidetector CT imaging of the chest was performed using the
standard protocol during bolus administration of intravenous
contrast. Multiplanar CT image reconstructions and MIPs were
obtained to evaluate the vascular anatomy.
CONTRAST:  100mL OMNIPAQUE IOHEXOL 350 MG/ML SOLN

[Series 4: pe soft · axial · 0.67mm/px · z∈[+836,+1073]mm · 14 of 93 slices shown]
[im 7/93  lung]
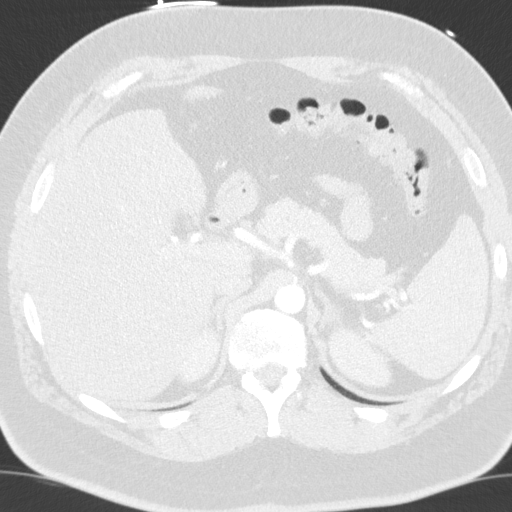
[im 13/93  soft-tissue]
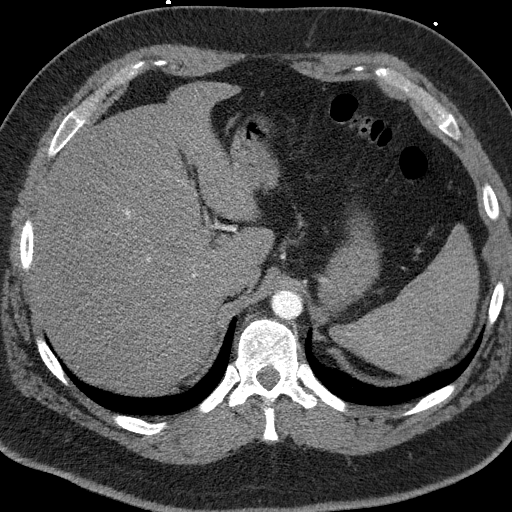
[im 19/93  lung]
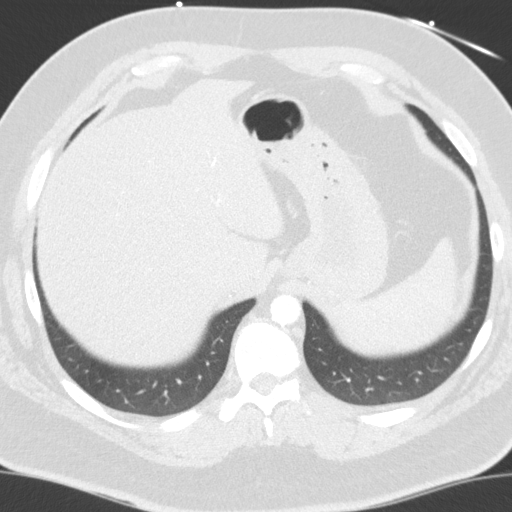
[im 25/93  soft-tissue]
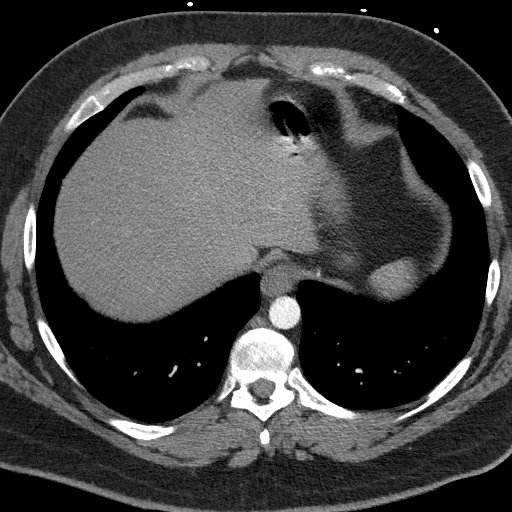
[im 31/93  lung]
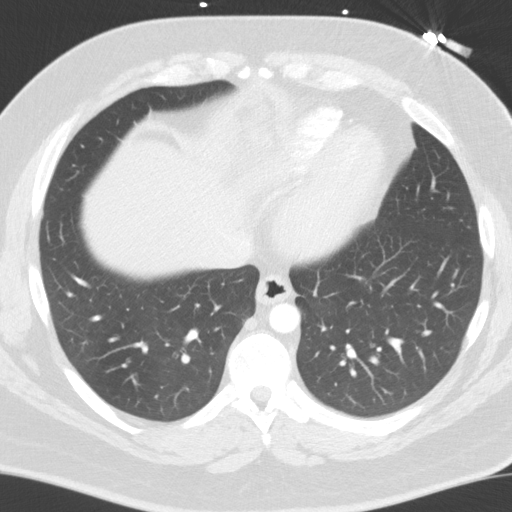
[im 37/93  soft-tissue]
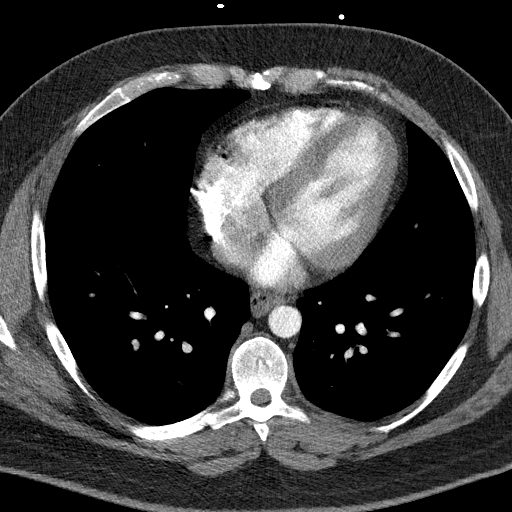
[im 43/93  lung]
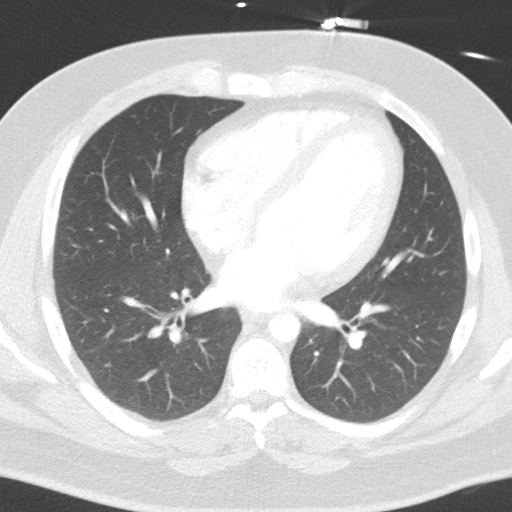
[im 50/93  soft-tissue]
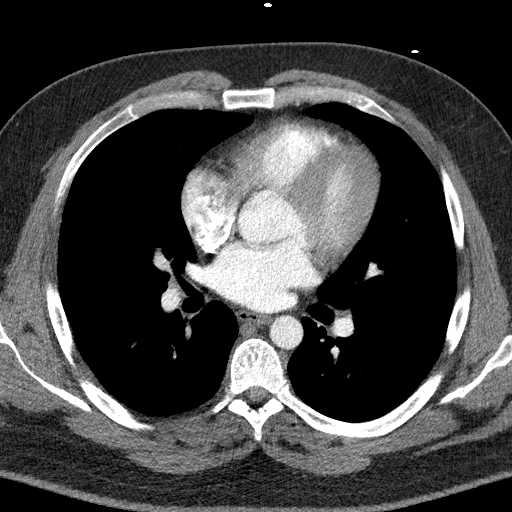
[im 56/93  lung]
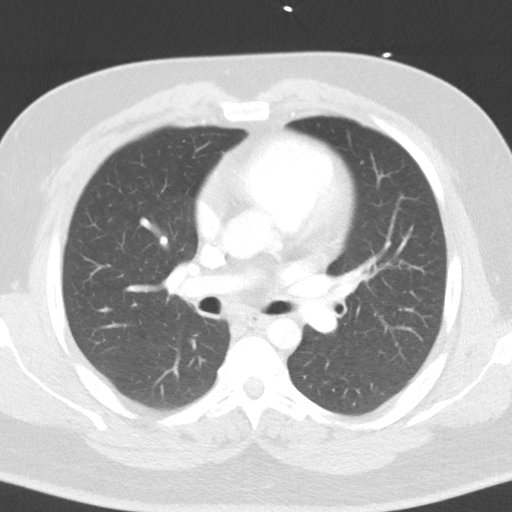
[im 62/93  soft-tissue]
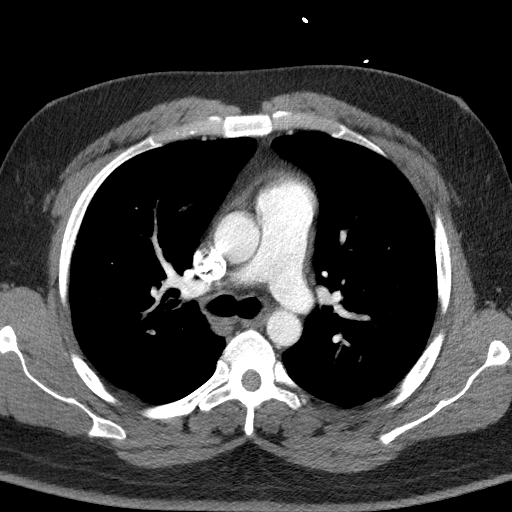
[im 68/93  lung]
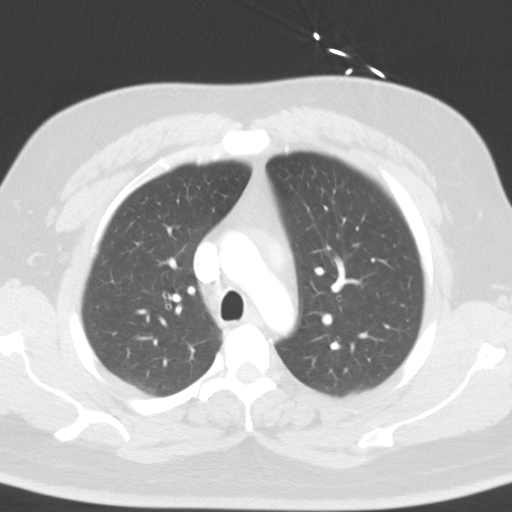
[im 74/93  soft-tissue]
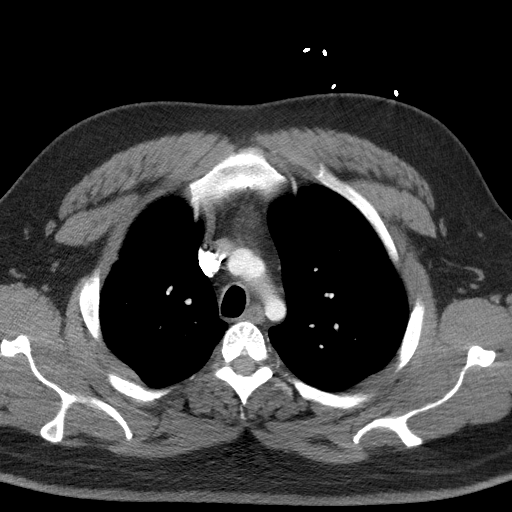
[im 80/93  lung]
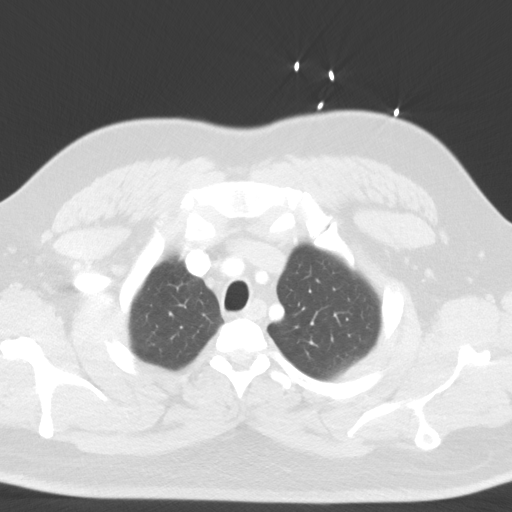
[im 86/93  soft-tissue]
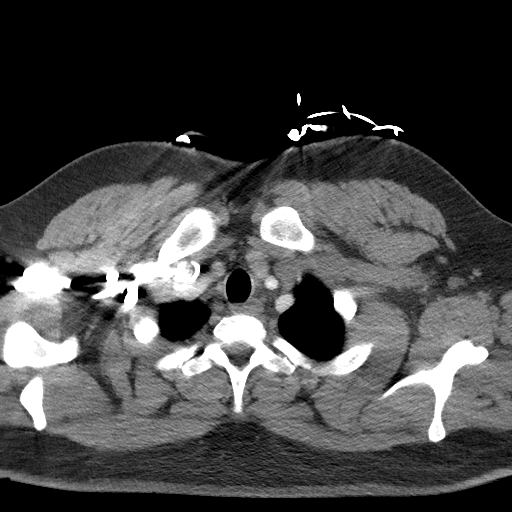

[Series 8: cor soft · coronal · 0.57mm/px · 3 of 101 slices shown]
[im 26/101  soft-tissue]
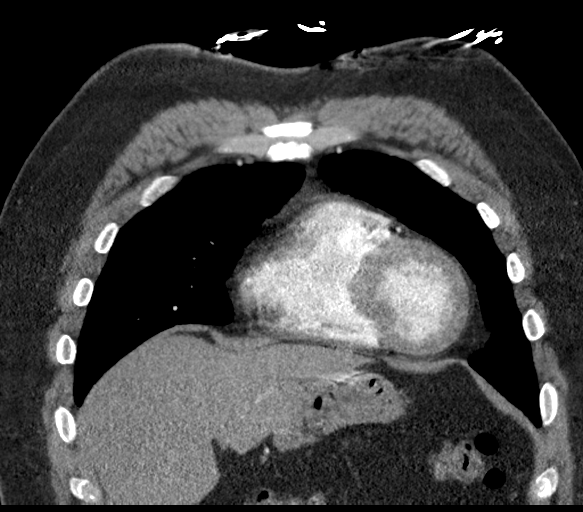
[im 51/101  soft-tissue]
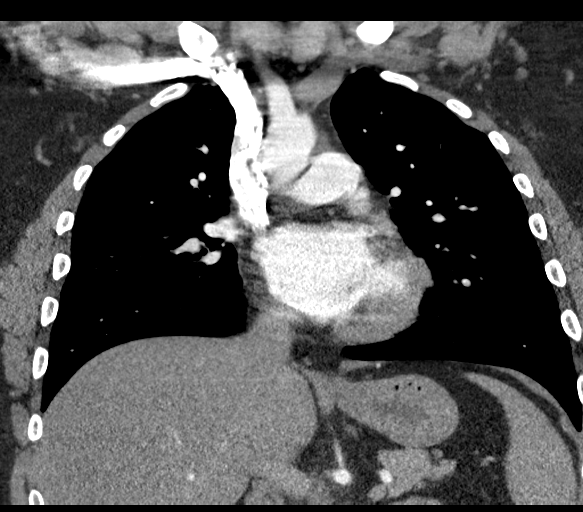
[im 76/101  soft-tissue]
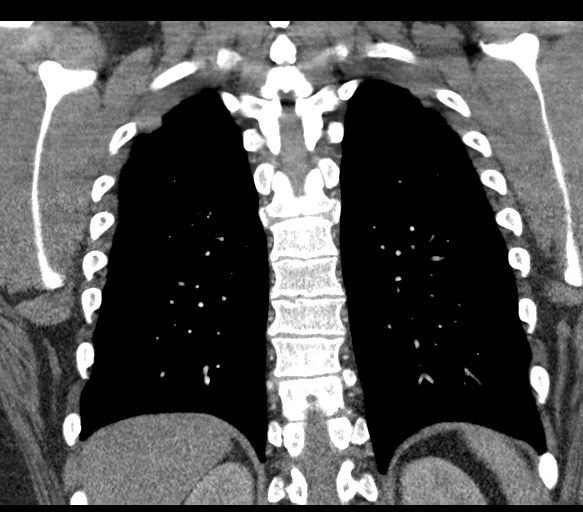

[17 of 46 positions shown; findings below may reference images not displayed]

FINDINGS: Cardiovascular: No filling defects in the pulmonary arteries to
suggest pulmonary emboli. Heart is normal size. Aorta is normal
caliber.

Mediastinum/Nodes: No mediastinal, hilar, or axillary adenopathy.
Trachea and esophagus are unremarkable. Thyroid unremarkable.

Lungs/Pleura: Lungs are clear. No focal airspace opacities or
suspicious nodules. No effusions.

Upper Abdomen: Imaging into the upper abdomen demonstrates no acute
findings.

Musculoskeletal: Chest wall soft tissues are unremarkable. No acute
bony abnormality.

Review of the MIP images confirms the above findings.
IMPRESSION: No evidence of pulmonary embolus.

No acute cardiopulmonary disease.
